# Patient Record
Sex: Female | Born: 1985 | Race: White | Hispanic: No | Marital: Married | Smoking: Former smoker
Health system: Southern US, Community
[De-identification: ages and names within clinical notes are randomized; demographics above are authoritative.]

## PROBLEM LIST (undated history)

## (undated) DIAGNOSIS — R87619 Unspecified abnormal cytological findings in specimens from cervix uteri: Secondary | ICD-10-CM

## (undated) DIAGNOSIS — N871 Moderate cervical dysplasia: Secondary | ICD-10-CM

## (undated) DIAGNOSIS — G43909 Migraine, unspecified, not intractable, without status migrainosus: Secondary | ICD-10-CM

## (undated) HISTORY — DX: Migraine, unspecified, not intractable, without status migrainosus: G43.909

## (undated) HISTORY — PX: WISDOM TOOTH EXTRACTION: SHX21

## (undated) HISTORY — PX: TONSILLECTOMY: SUR1361

## (undated) HISTORY — PX: LEEP: SHX91

## (undated) HISTORY — DX: Moderate cervical dysplasia: N87.1

## (undated) HISTORY — DX: Unspecified abnormal cytological findings in specimens from cervix uteri: R87.619

---
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
## 2006-04-20 DIAGNOSIS — R519 Headache, unspecified: Secondary | ICD-10-CM | POA: Insufficient documentation

## 2006-04-22 ENCOUNTER — Ambulatory Visit: Payer: Self-pay

## 2006-05-28 ENCOUNTER — Emergency Department: Payer: Self-pay | Admitting: Emergency Medicine

## 2007-11-22 ENCOUNTER — Emergency Department: Payer: Self-pay | Admitting: Emergency Medicine

## 2009-04-24 ENCOUNTER — Ambulatory Visit: Payer: Self-pay

## 2011-01-26 ENCOUNTER — Ambulatory Visit: Payer: Self-pay

## 2016-09-09 DIAGNOSIS — Z124 Encounter for screening for malignant neoplasm of cervix: Secondary | ICD-10-CM | POA: Diagnosis not present

## 2016-09-09 DIAGNOSIS — Z1389 Encounter for screening for other disorder: Secondary | ICD-10-CM | POA: Diagnosis not present

## 2016-09-09 DIAGNOSIS — Z3041 Encounter for surveillance of contraceptive pills: Secondary | ICD-10-CM | POA: Diagnosis not present

## 2016-09-09 DIAGNOSIS — Z01419 Encounter for gynecological examination (general) (routine) without abnormal findings: Secondary | ICD-10-CM | POA: Diagnosis not present

## 2016-09-09 LAB — HM PAP SMEAR

## 2016-11-25 ENCOUNTER — Encounter: Payer: Self-pay | Admitting: Family Medicine

## 2016-11-25 ENCOUNTER — Ambulatory Visit (INDEPENDENT_AMBULATORY_CARE_PROVIDER_SITE_OTHER): Payer: BLUE CROSS/BLUE SHIELD | Admitting: Family Medicine

## 2016-11-25 VITALS — BP 124/66 | HR 100 | Temp 98.4°F | Resp 17 | Wt 194.2 lb

## 2016-11-25 DIAGNOSIS — Z8742 Personal history of other diseases of the female genital tract: Secondary | ICD-10-CM | POA: Insufficient documentation

## 2016-11-25 DIAGNOSIS — Z85828 Personal history of other malignant neoplasm of skin: Secondary | ICD-10-CM | POA: Insufficient documentation

## 2016-11-25 DIAGNOSIS — J45909 Unspecified asthma, uncomplicated: Secondary | ICD-10-CM | POA: Insufficient documentation

## 2016-11-25 DIAGNOSIS — B349 Viral infection, unspecified: Secondary | ICD-10-CM

## 2016-11-25 DIAGNOSIS — J309 Allergic rhinitis, unspecified: Secondary | ICD-10-CM | POA: Insufficient documentation

## 2016-11-25 LAB — POC INFLUENZA A&B (BINAX/QUICKVUE)
Influenza A, POC: NEGATIVE
Influenza B, POC: NEGATIVE

## 2016-11-25 MED ORDER — HYDROCODONE-HOMATROPINE 5-1.5 MG/5ML PO SYRP: ORAL_SOLUTION | ORAL | 0 refills | Status: DC

## 2016-11-25 NOTE — Patient Instructions (Signed)
Discussed use of Mucinex D and two Aleve twice daily for aches along with cough syrup.

## 2016-11-25 NOTE — Progress Notes (Signed)
Subjective:     Patient ID: Desiree Rivera, female   DOB: [birth year], 31 y.o.   MRN: [id number]  HPI  Chief Complaint  Patient presents with  . Cough    Patient comes in offie today with cocnerns of cough and low grade fever since 11/23/16. Patient reports that fever has been ranging from 98-102, paitent reports the following symptoms; congestion, sinus pain and pressure, muscle ache and vomiting. Patient has been taking otc Dayquil and Mucinex.   Has not had the flu shot. 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   Review of Systems     Objective:   Physical Exam  Constitutional: She appears well-developed and well-nourished. She has a sickly appearance.  Ears: T.M's intact without inflammation Throat: tonsils absent; no posterior pharyngeal erythema Neck: no cervical adenopathy Lungs: clear     Assessment:    1. Viral syndrome: suspect flu - POC Influenza A&B(BINAX/QUICKVUE) - HYDROcodone-homatropine (HYCODAN) 5-1.5 MG/5ML syrup; 5 ml 4-6 hours as needed for cough  Dispense: 240 mL; Refill: 0    Plan:    Discussed use of mask, Mucinex D and nsaid's while ill.

## 2016-11-30 DIAGNOSIS — L814 Other melanin hyperpigmentation: Secondary | ICD-10-CM | POA: Diagnosis not present

## 2016-11-30 DIAGNOSIS — D229 Melanocytic nevi, unspecified: Secondary | ICD-10-CM | POA: Diagnosis not present

## 2016-11-30 DIAGNOSIS — Z1283 Encounter for screening for malignant neoplasm of skin: Secondary | ICD-10-CM | POA: Diagnosis not present

## 2016-11-30 DIAGNOSIS — L578 Other skin changes due to chronic exposure to nonionizing radiation: Secondary | ICD-10-CM | POA: Diagnosis not present

## 2017-07-29 ENCOUNTER — Encounter: Payer: Self-pay | Admitting: Family Medicine

## 2017-11-13 ENCOUNTER — Other Ambulatory Visit: Payer: Self-pay | Admitting: Obstetrics and Gynecology

## 2017-11-18 ENCOUNTER — Ambulatory Visit: Payer: BLUE CROSS/BLUE SHIELD | Admitting: Family Medicine

## 2017-11-18 VITALS — BP 130/80 | HR 50 | Temp 99.0°F | Resp 16 | Ht 65.0 in | Wt 185.0 lb

## 2017-11-18 DIAGNOSIS — M5441 Lumbago with sciatica, right side: Secondary | ICD-10-CM

## 2017-11-18 DIAGNOSIS — G8929 Other chronic pain: Secondary | ICD-10-CM

## 2017-11-18 DIAGNOSIS — M7061 Trochanteric bursitis, right hip: Secondary | ICD-10-CM

## 2017-11-18 MED ORDER — PREDNISONE 20 MG PO TABS: ORAL_TABLET | ORAL | 0 refills | Status: DC

## 2017-11-18 NOTE — Progress Notes (Signed)
Subjective:     Patient ID: Desiree Rivera, female   DOB: 1986/05/10, 31 y.o.   MRN: [id number] Chief Complaint  Patient presents with  . Back Pain    she reports that she has been having pain in her right hip buttock area. she reports that she was working out alot then stopped for about a year then she started having pain so she thought mayne it was becuase she stopped working out so she has went back to working out and lost 25 pounds ans it is not better. She has also been to the Chiropractor and he was able to relieve some of the pain. She reports that it hurts all the time. It hurts to life her leg and to walk to sit.   . Hip Pain   HPI States pain in her right sided back, right upper thigh, and right lateral hip have been occurring since March of this year. She continues to use the treadmill and weights. 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 She has had 6 massage sessions and one chiropractor visit a week ago with transient improvement. Will take ibuprofen 600 mg with Tylenol 1000 mg.at night when she can't sleep due to pain. She generally can not sleep on the right side.     Objective:   Physical Exam  Constitutional: She appears well-developed and well-nourished. No distress.  Musculoskeletal:  Muscle strength in lower extremities 5/5. SLR's to 90 degrees without radiation of back pain. Right hip IR/ER without discomfort. Tender in her R SI, trochanteric bursa, and ischial/sciatic area.       Assessment:    1. Trochanteric bursitis of right hip - predniSONE (DELTASONE) 20 MG tablet; Taper as follows: 3 pills for 4 days, two pills for 4 days, one pill for four days  Dispense: 24 tablet; Refill: 0  2. Chronic right-sided low back pain with right-sided sciatic - predniSONE (DELTASONE) 20 MG tablet; Taper as follows: 3 pills for 4 days, two pills for 4 days, one pill for four days  Dispense: 24 tablet; Refill: 0    Plan:    Discussed cross training  and rational use of Tylenol. Will call for orthopedic referral if not improving with prednisone.

## 2017-11-18 NOTE — Patient Instructions (Signed)
May continue to take tylenol up to 3000 mg/day. Call me if not improving for an orthopedic referral. Cross train and give your back a rest. Walking ok.

## 2017-12-22 ENCOUNTER — Ambulatory Visit: Payer: BLUE CROSS/BLUE SHIELD | Admitting: Family Medicine

## 2017-12-22 ENCOUNTER — Encounter: Payer: Self-pay | Admitting: Family Medicine

## 2017-12-22 VITALS — BP 118/78 | HR 84 | Temp 98.4°F | Wt 182.6 lb

## 2017-12-22 DIAGNOSIS — R509 Fever, unspecified: Secondary | ICD-10-CM

## 2017-12-22 DIAGNOSIS — B349 Viral infection, unspecified: Secondary | ICD-10-CM

## 2017-12-22 DIAGNOSIS — R52 Pain, unspecified: Secondary | ICD-10-CM

## 2017-12-22 LAB — POCT INFLUENZA A/B
INFLUENZA A, POC: NEGATIVE
Influenza B, POC: NEGATIVE

## 2017-12-22 NOTE — Progress Notes (Signed)
Subjective:     Patient ID: Desiree Rivera, female   DOB: 06-Oct-1986, 32 y.o.   MRN: [id number] Chief Complaint  Patient presents with  . URI    Patient complains of sinus pressure/pain,fever, cough, congestion,sore throat,sweats, and light sensitivity. Symptoms started on Monday and are gradually worsening. She reports taking Zicam. DyQuil, NyQuil and Sudafed for symptoms with no relief.    HPI Reports flu like sx onset over the last 3 days. No flu shot this season. Reports she has left over cough syrup at home.  Review of Systems     Objective:   Physical Exam  Constitutional: She appears well-developed and well-nourished. No distress.  Ears: T.M's intact without inflammation Throat: tonsils absent without posterior pharyngeal erythema Neck: mild right anterior cervical tendernes Lungs: clear     Assessment:    1. Fever, unspecified fever cause - POCT Influenza A/B  2. Generalized body aches - POCT Influenza A/B  3. Acute viral syndrome: possible flu    Plan:   Work excuse from 1/30-12/26/17. Discussed sx treatment. Masks provided.

## 2017-12-22 NOTE — Patient Instructions (Signed)
Discussed Mucinex D, prescription cough syrup or Delsym for cough

## 2018-01-10 ENCOUNTER — Telehealth: Payer: Self-pay

## 2018-01-10 MED ORDER — NORGESTIM-ETH ESTRAD TRIPHASIC 0.18/0.215/0.25 MG-35 MCG PO TABS: 1.0000 | ORAL_TABLET | Freq: Every day | ORAL | 0 refills | Status: DC

## 2018-01-10 NOTE — Telephone Encounter (Signed)
Pt sched annual for 3/4th and need refill of bc.  Pt aware refill eRx.

## 2018-01-24 ENCOUNTER — Encounter: Payer: Self-pay | Admitting: Obstetrics and Gynecology

## 2018-01-24 ENCOUNTER — Ambulatory Visit (INDEPENDENT_AMBULATORY_CARE_PROVIDER_SITE_OTHER): Payer: BLUE CROSS/BLUE SHIELD | Admitting: Obstetrics and Gynecology

## 2018-01-24 VITALS — BP 122/74 | Ht 65.0 in | Wt 180.0 lb

## 2018-01-24 DIAGNOSIS — Z1339 Encounter for screening examination for other mental health and behavioral disorders: Secondary | ICD-10-CM

## 2018-01-24 DIAGNOSIS — Z113 Encounter for screening for infections with a predominantly sexual mode of transmission: Secondary | ICD-10-CM

## 2018-01-24 DIAGNOSIS — Z01419 Encounter for gynecological examination (general) (routine) without abnormal findings: Secondary | ICD-10-CM | POA: Diagnosis not present

## 2018-01-24 DIAGNOSIS — Z1331 Encounter for screening for depression: Secondary | ICD-10-CM | POA: Diagnosis not present

## 2018-01-24 DIAGNOSIS — Z3041 Encounter for surveillance of contraceptive pills: Secondary | ICD-10-CM | POA: Diagnosis not present

## 2018-01-24 DIAGNOSIS — Z124 Encounter for screening for malignant neoplasm of cervix: Secondary | ICD-10-CM | POA: Diagnosis not present

## 2018-01-24 LAB — HM PAP SMEAR: HM Pap smear: NEGATIVE

## 2018-01-24 MED ORDER — NORGESTIM-ETH ESTRAD TRIPHASIC 0.18/0.215/0.25 MG-35 MCG PO TABS
1.0000 | ORAL_TABLET | Freq: Every day | ORAL | 4 refills | Status: DC
Start: 2018-01-24 — End: 2019-04-07

## 2018-01-24 NOTE — Progress Notes (Signed)
Gynecology Annual Exam   PCP: Malva LimesFisher, Donald E, MD  Chief Complaint  Patient presents with  . Annual Exam   History of Present Illness:  [name] is a 32 y.o. G3P0030 who LMP was Patient's last menstrual period was 01/10/2018., presents today for her annual examination.  Her menses are regular every 28-30 days, lasting 3-4 day(s).  Dysmenorrhea mild, occurring first 1-2 days of flow. She does not have intermenstrual bleeding.  She is single partner, contraception - OCP (estrogen/progesterone).  Last Pap: 08/2016  Results were: no abnormalities /neg HPV DNA negative (history of LEEP in 01/2015 with CIN II on pathology with negative margins) Hx of STDs: HPV  There is no FH of breast cancer. There is no FH of ovarian cancer.   Tobacco use: former. Alcohol use: social drinker Exercise: very active  The patient wears seatbelts: yes.   The patient reports that domestic violence in her life is absent.   Patient self-diagnosed with migraines. Treats with OTC ibuprofen/tylenol, denies aura.  Does not take Rx medication for migraines.    Past Medical History:  Diagnosis Date  . Abnormal Pap smear of cervix   . CIN II (cervical intraepithelial neoplasia II)   . Migraine     Past Surgical History:  Procedure Laterality Date  . LEEP    . TONSILLECTOMY    . WISDOM TOOTH EXTRACTION      Prior to Admission medications   Medication Sig Start Date End Date Taking? Authorizing Provider  MULTIPLE VITAMIN PO Take by mouth.   Yes [provider]  Norgestimate-Ethinyl Estradiol Triphasic (TRI-SPRINTEC) 0.18/0.215/0.25 MG-35 MCG tablet Take 1 tablet by mouth daily. 01/10/18  Yes Conard NovakJackson, Prerana Strayer D, MD  Pseudoephedrine-APAP-DM (DAYQUIL PO) Take by mouth.    [provider]    Allergies  Allergen Reactions  . Sulfa Antibiotics Rash   Obstetric History: G3P0030  Social History   Socioeconomic History  . Marital status: Married    Spouse name: Not on file   . Number of children: Not on file  . Years of education: Not on file  . Highest education level: Not on file  Social Needs  . Financial resource strain: Not on file  . Food insecurity - worry: Not on file  . Food insecurity - inability: Not on file  . Transportation needs - medical: Not on file  . Transportation needs - non-medical: Not on file  Occupational History  . Not on file  Tobacco Use  . Smoking status: Former Smoker    Last attempt to quit: 11/24/2007    Years since quitting: 10.1  . Smokeless tobacco: Never Used  Substance and Sexual Activity  . Alcohol use: Not on file  . Drug use: No  . Sexual activity: Yes    Partners: Male    Birth control/protection: Pill  Other Topics Concern  . Not on file  Social History Narrative  . Not on file    Family History  Problem Relation Age of Onset  . Diabetes Mellitus II Maternal Grandmother   . Thyroid cancer Maternal Aunt   . Lung cancer Maternal Grandfather     Review of Systems  Constitutional: Negative.   HENT: Negative.   Eyes: Negative.   Respiratory: Negative.   Cardiovascular: Negative.   Gastrointestinal: Negative.   Genitourinary: Negative.   Musculoskeletal: Negative.   Skin: Negative.   Neurological: Negative.   Psychiatric/Behavioral: Negative.      Physical Exam BP 122/74   Ht  5\' 5"  (1.651 m)   Wt 180 lb (81.6 kg)   LMP 01/10/2018   BMI 29.95 kg/m    Physical Exam  Constitutional: She is oriented to person, place, and time. She appears well-developed and well-nourished. No distress.  Genitourinary: Uterus normal. Pelvic exam was performed with patient supine. There is no rash, tenderness, lesion or injury on the right labia. There is no rash, tenderness, lesion or injury on the left labia. No erythema, tenderness or bleeding in the vagina. No signs of injury around the vagina. No vaginal discharge found. Right adnexum does not display mass, does not display tenderness and does not display  fullness. Left adnexum does not display mass, does not display tenderness and does not display fullness. Cervix does not exhibit motion tenderness, lesion, discharge or polyp.   Uterus is mobile and anteverted. Uterus is not enlarged, tender or exhibiting a mass.  HENT:  Head: Normocephalic and atraumatic.  Eyes: EOM are normal. No scleral icterus.  Neck: Normal range of motion. Neck supple. No thyromegaly present.  Cardiovascular: Normal rate and regular rhythm. Exam reveals no gallop and no friction rub.  No murmur heard. Pulmonary/Chest: Effort normal and breath sounds normal. No respiratory distress. She has no wheezes. She has no rales. Right breast exhibits no inverted nipple, no mass, no nipple discharge, no skin change and no tenderness. Left breast exhibits no inverted nipple, no mass, no nipple discharge, no skin change and no tenderness.  Abdominal: Soft. Bowel sounds are normal. She exhibits no distension and no mass. There is no tenderness. There is no rebound and no guarding.  Musculoskeletal: Normal range of motion. She exhibits no edema or tenderness.  Lymphadenopathy:    She has no cervical adenopathy.       Right: No inguinal adenopathy present.       Left: No inguinal adenopathy present.  Neurological: She is alert and oriented to person, place, and time. No cranial nerve deficit.  Skin: Skin is warm and dry. No rash noted. No erythema.  Psychiatric: She has a normal mood and affect. Her behavior is normal. Judgment normal.    Female chaperone present for pelvic and breast  portions of the physical exam  Results: AUDIT Questionnaire (screen for alcoholism): 5 PHQ-9: 1  Assessment: 32 y.o. G42P0030 female here for routine annual gynecologic examination  Plan: Problem List Items Addressed This Visit    None    Visit Diagnoses    Women's annual routine gynecological examination    -  Primary   Relevant Medications   Norgestimate-Ethinyl Estradiol Triphasic  (TRI-SPRINTEC) 0.18/0.215/0.25 MG-35 MCG tablet   Other Relevant Orders   IGP,CtNg,AptimaHPV,rfx16/18,45   Screening for depression       Screening for alcoholism       Pap smear for cervical cancer screening       Relevant Orders   IGP,CtNg,AptimaHPV,rfx16/18,45   Screen for STD (sexually transmitted disease)       Relevant Orders   IGP,CtNg,AptimaHPV,rfx16/18,45   Encounter for surveillance of contraceptive pills       Relevant Medications   Norgestimate-Ethinyl Estradiol Triphasic (TRI-SPRINTEC) 0.18/0.215/0.25 MG-35 MCG tablet     Screening: -- Blood pressure screen normal -- Weight screening: overweight: continue to monitor -- Depression screening negative (PHQ-9) -- Nutrition: normal -- cholesterol screening: not due for screening -- osteoporosis screening: not due -- tobacco screening: not using -- alcohol screening: AUDIT questionnaire indicates low-risk usage. -- family history of breast cancer screening: done. not at high risk. -- no  evidence of domestic violence or intimate partner violence. -- STD screening: gonorrhea/chlamydia NAAT collected -- pap smear collected per ASCCP guidelines -- flu vaccine declined -- HPV vaccination series: not eligilbe  Thomasene Mohair, MD 01/24/2018 4:19 PM

## 2018-01-28 LAB — IGP,CTNG,APTIMAHPV,RFX16/18,45
Chlamydia, Nuc. Acid Amp: NEGATIVE
GONOCOCCUS BY NUCLEIC ACID AMP: NEGATIVE
HPV Aptima: NEGATIVE
PAP SMEAR COMMENT: 0

## 2019-03-06 ENCOUNTER — Encounter: Payer: Self-pay | Admitting: Family Medicine

## 2019-03-06 DIAGNOSIS — L237 Allergic contact dermatitis due to plants, except food: Secondary | ICD-10-CM

## 2019-03-07 MED ORDER — PREDNISONE 20 MG PO TABS: ORAL_TABLET | ORAL | 0 refills | Status: AC

## 2019-03-07 MED ORDER — CEPHALEXIN 500 MG PO CAPS: 500.0000 mg | ORAL_CAPSULE | Freq: Four times a day (QID) | ORAL | 0 refills | Status: AC

## 2019-04-05 ENCOUNTER — Other Ambulatory Visit: Payer: Self-pay | Admitting: Obstetrics and Gynecology

## 2019-04-05 DIAGNOSIS — Z3041 Encounter for surveillance of contraceptive pills: Secondary | ICD-10-CM

## 2019-04-05 DIAGNOSIS — Z01419 Encounter for gynecological examination (general) (routine) without abnormal findings: Secondary | ICD-10-CM

## 2019-04-05 NOTE — Telephone Encounter (Signed)
Advise

## 2019-04-07 NOTE — Telephone Encounter (Signed)
Can someone please let patients know, who are calling multiple times to check on the status of a prescription or question, that the provider is out of the office?? This would help so much with patient satisfaction.

## 2019-04-07 NOTE — Telephone Encounter (Signed)
Pt calling to ck the status of refill she requested on Wed.  Pharm hasn't heard back.  [phone]

## 2019-06-01 ENCOUNTER — Encounter: Payer: Self-pay | Admitting: Family Medicine

## 2019-06-02 MED ORDER — KETOCONAZOLE 2 % EX CREA: 1.0000 "application " | TOPICAL_CREAM | Freq: Two times a day (BID) | CUTANEOUS | 0 refills | Status: DC

## 2019-06-23 ENCOUNTER — Other Ambulatory Visit: Payer: Self-pay

## 2019-06-23 DIAGNOSIS — Z01419 Encounter for gynecological examination (general) (routine) without abnormal findings: Secondary | ICD-10-CM

## 2019-06-23 DIAGNOSIS — Z3041 Encounter for surveillance of contraceptive pills: Secondary | ICD-10-CM

## 2019-06-23 MED ORDER — NORGESTIM-ETH ESTRAD TRIPHASIC 0.18/0.215/0.25 MG-35 MCG PO TABS: 1.0000 | ORAL_TABLET | Freq: Every day | ORAL | 0 refills | Status: DC

## 2019-06-23 NOTE — Telephone Encounter (Signed)
Spoke w/pt. Notified 1 mo refill sent to requested pharmacy.

## 2019-06-23 NOTE — Telephone Encounter (Signed)
Pt has scheduled AE for 07/07/2019 w/SDJ. She only has 1 wk of pills left. Please sent refill to [facility], [place]. AY#[phone]

## 2019-07-07 ENCOUNTER — Other Ambulatory Visit (HOSPITAL_COMMUNITY)
Admission: RE | Admit: 2019-07-07 | Discharge: 2019-07-07 | Disposition: A | Discharge status: Home / Self Care | Payer: BC Managed Care – PPO | Source: Ambulatory Visit | Attending: Obstetrics and Gynecology | Admitting: Obstetrics and Gynecology

## 2019-07-07 ENCOUNTER — Other Ambulatory Visit: Payer: Self-pay

## 2019-07-07 ENCOUNTER — Ambulatory Visit (INDEPENDENT_AMBULATORY_CARE_PROVIDER_SITE_OTHER): Payer: BC Managed Care – PPO | Admitting: Obstetrics and Gynecology

## 2019-07-07 ENCOUNTER — Encounter: Payer: Self-pay | Admitting: Obstetrics and Gynecology

## 2019-07-07 VITALS — BP 124/74 | Ht 65.0 in | Wt 182.0 lb

## 2019-07-07 DIAGNOSIS — Z01419 Encounter for gynecological examination (general) (routine) without abnormal findings: Secondary | ICD-10-CM | POA: Insufficient documentation

## 2019-07-07 DIAGNOSIS — Z113 Encounter for screening for infections with a predominantly sexual mode of transmission: Secondary | ICD-10-CM | POA: Insufficient documentation

## 2019-07-07 DIAGNOSIS — Z124 Encounter for screening for malignant neoplasm of cervix: Secondary | ICD-10-CM | POA: Diagnosis not present

## 2019-07-07 DIAGNOSIS — Z1339 Encounter for screening examination for other mental health and behavioral disorders: Secondary | ICD-10-CM

## 2019-07-07 DIAGNOSIS — Z3041 Encounter for surveillance of contraceptive pills: Secondary | ICD-10-CM

## 2019-07-07 DIAGNOSIS — Z1331 Encounter for screening for depression: Secondary | ICD-10-CM

## 2019-07-07 MED ORDER — NORGESTIM-ETH ESTRAD TRIPHASIC 0.18/0.215/0.25 MG-35 MCG PO TABS: 1.0000 | ORAL_TABLET | Freq: Every day | ORAL | 4 refills | Status: DC

## 2019-07-07 NOTE — Progress Notes (Signed)
Gynecology Annual Exam  PCP: Malva LimesFisher, Donald E, MD  Chief Complaint  Patient presents with  . Annual Exam   History of Present Illness:  Ms. Desiree Rivera is a 33 y.o. G3P0030 who LMP was Patient's last menstrual period was 06/18/2019., presents today for her annual examination.  Her menses are regular every 28-30 days, lasting 5 day(s).  Dysmenorrhea mild, occurring first 1-2 days of flow. She does not have intermenstrual bleeding. She states that she gets a migraine headache only very occasionally. She gets other types of headaches more frequently.  She has issues with clenching her teeth, sinus issues.  If she ever gets a migraine, she covers her face up and gets in a quiet place.    She is sexually active with a single partner.  Contraception - OCP (estrogen/progesterone).  Last Pap: 1 year  Results were: no abnormalities /neg HPV DNA negative (history of LEEP in 2016 for CIN II with negative margins) Hx of STDs: HPV  There is no FH of breast cancer. There is no FH of ovarian cancer.   Tobacco use: former. Alcohol use: social drinker Exercise: very active  The patient wears seatbelts: yes.   The patient reports that domestic violence in her life is absent.   Past Medical History:  Diagnosis Date  . Abnormal Pap smear of cervix   . CIN II (cervical intraepithelial neoplasia II)   . Migraine     Past Surgical History:  Procedure Laterality Date  . LEEP    . TONSILLECTOMY    . WISDOM TOOTH EXTRACTION      Prior to Admission medications   Medication Sig Start Date End Date Taking? Authorizing Provider  MULTIPLE VITAMIN PO Take by mouth.   Yes [provider]  Norgestimate-Ethinyl Estradiol Triphasic (TRI-SPRINTEC) 0.18/0.215/0.25 MG-35 MCG tablet Take 1 tablet by mouth daily for 28 days. 06/23/19 07/21/19 Yes Tresea MallGledhill, Jane, CNM  ketoconazole (NIZORAL) 2 % cream Apply 1 application topically 2 (two) times daily. Patient not taking: Reported on 07/07/2019 06/02/19    Malva LimesFisher, Donald E, MD  Pseudoephedrine-APAP-DM (DAYQUIL PO) Take by mouth.    [provider]    Allergies  Allergen Reactions  . Sulfa Antibiotics Rash   Obstetric History: G3P0030  Social History   Socioeconomic History  . Marital status: Married    Spouse name: Not on file  . Number of children: Not on file  . Years of education: Not on file  . Highest education level: Not on file  Occupational History  . Not on file  Social Needs  . Financial resource strain: Not on file  . Food insecurity    Worry: Not on file    Inability: Not on file  . Transportation needs    Medical: Not on file    Non-medical: Not on file  Tobacco Use  . Smoking status: Former Smoker    Quit date: 11/24/2007    Years since quitting: 11.6  . Smokeless tobacco: Never Used  Substance and Sexual Activity  . Alcohol use: Not on file  . Drug use: No  . Sexual activity: Yes    Partners: Male    Birth control/protection: Pill  Lifestyle  . Physical activity    Days per week: Not on file    Minutes per session: Not on file  . Stress: Not on file  Relationships  . Social Musicianconnections    Talks on phone: Not on file    Gets together: Not on file  Attends religious service: Not on file    Active member of club or organization: Not on file    Attends meetings of clubs or organizations: Not on file    Relationship status: Not on file  . Intimate partner violence    Fear of current or ex partner: Not on file    Emotionally abused: Not on file    Physically abused: Not on file    Forced sexual activity: Not on file  Other Topics Concern  . Not on file  Social History Narrative  . Not on file    Family History  Problem Relation Age of Onset  . Diabetes Mellitus II Maternal Grandmother   . Thyroid cancer Maternal Aunt   . Lung cancer Maternal Grandfather     Review of Systems  Constitutional: Negative.   HENT: Negative.   Eyes: Negative.   Respiratory: Negative.    Cardiovascular: Negative.   Gastrointestinal: Negative.   Genitourinary: Negative.   Musculoskeletal: Negative.   Skin: Negative.   Neurological: Negative.   Psychiatric/Behavioral: Negative.      Physical Exam BP 124/74   Ht 5\' 5"  (1.651 m)   Wt 182 lb (82.6 kg)   LMP 06/18/2019   BMI 30.29 kg/m   Physical Exam Constitutional:      General: She is not in acute distress.    Appearance: Normal appearance. She is well-developed.  Genitourinary:     Pelvic exam was performed with patient supine.     Vulva, urethra, bladder and uterus normal.     No inguinal adenopathy present in the right or left side.    No signs of injury in the vagina.     No vaginal discharge, erythema, tenderness or bleeding.     No cervical motion tenderness, discharge, lesion or polyp.     Uterus is mobile.     Uterus is not enlarged or tender.     No uterine mass detected.    Uterus is anteverted.     No right or left adnexal mass present.     Right adnexa not tender or full.     Left adnexa not tender or full.  HENT:     Head: Normocephalic and atraumatic.  Eyes:     General: No scleral icterus.    Conjunctiva/sclera: Conjunctivae normal.  Neck:     Musculoskeletal: Normal range of motion and neck supple.     Thyroid: No thyromegaly.  Cardiovascular:     Rate and Rhythm: Normal rate and regular rhythm.     Heart sounds: No murmur. No friction rub. No gallop.   Pulmonary:     Effort: Pulmonary effort is normal. No respiratory distress.     Breath sounds: Normal breath sounds. No wheezing or rales.  Chest:     Breasts:        Right: No inverted nipple, mass, nipple discharge, skin change or tenderness.        Left: No inverted nipple, mass, nipple discharge, skin change or tenderness.  Abdominal:     General: Bowel sounds are normal. There is no distension.     Palpations: Abdomen is soft. There is no mass.     Tenderness: There is no abdominal tenderness. There is no guarding or rebound.   Musculoskeletal: Normal range of motion.        General: No swelling or tenderness.  Lymphadenopathy:     Cervical: No cervical adenopathy.     Lower Body: No right inguinal adenopathy. No left  inguinal adenopathy.  Neurological:     General: No focal deficit present.     Mental Status: She is alert and oriented to person, place, and time.     Cranial Nerves: No cranial nerve deficit.  Skin:    General: Skin is warm and dry.     Findings: No erythema or rash.  Psychiatric:        Mood and Affect: Mood normal.        Behavior: Behavior normal.        Judgment: Judgment normal.     Female chaperone present for pelvic and breast  portions of the physical exam  Results: AUDIT Questionnaire (screen for alcoholism): 7 PHQ-9: 3   Assessment: 33 y.o. G59P0030 female here for routine annual gynecologic examination  Plan: Problem List Items Addressed This Visit    None    Visit Diagnoses    Women's annual routine gynecological examination    -  Primary   Relevant Medications   Norgestimate-Ethinyl Estradiol Triphasic (TRI-SPRINTEC) 0.18/0.215/0.25 MG-35 MCG tablet   Other Relevant Orders   Cytology - PAP   Screening for depression       Screening for alcoholism       Pap smear for cervical cancer screening       Relevant Orders   Cytology - PAP   Encounter for surveillance of contraceptive pills       Relevant Medications   Norgestimate-Ethinyl Estradiol Triphasic (TRI-SPRINTEC) 0.18/0.215/0.25 MG-35 MCG tablet   Screen for STD (sexually transmitted disease)       Relevant Orders   Cytology - PAP      Screening: -- Blood pressure screen normal -- Weight screening: overweight: continue to monitor -- Depression screening negative (PHQ-9) -- Nutrition: normal -- cholesterol screening: not due for screening -- osteoporosis screening: not due -- tobacco screening: not using -- alcohol screening: AUDIT questionnaire indicates low-risk usage. -- family history of breast  cancer screening: done. not at high risk. -- no evidence of domestic violence or intimate partner violence. -- STD screening: gonorrhea/chlamydia NAAT collected -- pap smear collected per ASCCP guidelines  Contraceptive management: renewed contraception. Discussed migraines as a contraindication to use of estrogen-containing contraception. She states that she has headaches, but has never had to take anything stronger than Tylenol or ibuprofen. They don't last long. She has no aura.  She has never been formally diagnosed with a migraine disorder.  She has been taking this form of contraception since age 7.  Precautions given and risks. She would like to continue. For now, I think this is reasonable.  However, if her headaches become worse or more frequent, we discussed having to change types of contraception.   Prentice Docker, MD 07/07/2019 9:37 AM

## 2019-07-11 LAB — CYTOLOGY - PAP
Chlamydia: NEGATIVE
Diagnosis: NEGATIVE
HPV: NOT DETECTED
Neisseria Gonorrhea: NEGATIVE

## 2019-07-18 DIAGNOSIS — M530 Cervicocranial syndrome: Secondary | ICD-10-CM | POA: Diagnosis not present

## 2019-07-18 DIAGNOSIS — M9902 Segmental and somatic dysfunction of thoracic region: Secondary | ICD-10-CM | POA: Diagnosis not present

## 2019-07-18 DIAGNOSIS — M531 Cervicobrachial syndrome: Secondary | ICD-10-CM | POA: Diagnosis not present

## 2019-07-18 DIAGNOSIS — M5386 Other specified dorsopathies, lumbar region: Secondary | ICD-10-CM | POA: Diagnosis not present

## 2019-08-02 DIAGNOSIS — M530 Cervicocranial syndrome: Secondary | ICD-10-CM | POA: Diagnosis not present

## 2019-08-02 DIAGNOSIS — M531 Cervicobrachial syndrome: Secondary | ICD-10-CM | POA: Diagnosis not present

## 2019-08-02 DIAGNOSIS — M9901 Segmental and somatic dysfunction of cervical region: Secondary | ICD-10-CM | POA: Diagnosis not present

## 2019-08-02 DIAGNOSIS — M9902 Segmental and somatic dysfunction of thoracic region: Secondary | ICD-10-CM | POA: Diagnosis not present

## 2019-08-24 ENCOUNTER — Encounter: Payer: Self-pay | Admitting: Family Medicine

## 2019-08-24 MED ORDER — KETOCONAZOLE 2 % EX CREA: 1.0000 "application " | TOPICAL_CREAM | Freq: Two times a day (BID) | CUTANEOUS | 0 refills | Status: DC

## 2020-09-17 ENCOUNTER — Other Ambulatory Visit: Payer: Self-pay | Admitting: Obstetrics and Gynecology

## 2020-09-17 DIAGNOSIS — Z3041 Encounter for surveillance of contraceptive pills: Secondary | ICD-10-CM

## 2020-09-17 DIAGNOSIS — Z01419 Encounter for gynecological examination (general) (routine) without abnormal findings: Secondary | ICD-10-CM

## 2020-10-07 ENCOUNTER — Other Ambulatory Visit: Payer: Self-pay

## 2020-10-07 ENCOUNTER — Ambulatory Visit (INDEPENDENT_AMBULATORY_CARE_PROVIDER_SITE_OTHER): Payer: BC Managed Care – PPO | Admitting: Family Medicine

## 2020-10-07 ENCOUNTER — Encounter: Payer: Self-pay | Admitting: Family Medicine

## 2020-10-07 VITALS — BP 133/82 | HR 67 | Temp 98.7°F | Resp 16 | Wt 174.0 lb

## 2020-10-07 DIAGNOSIS — M25559 Pain in unspecified hip: Secondary | ICD-10-CM | POA: Diagnosis not present

## 2020-10-07 DIAGNOSIS — M545 Low back pain, unspecified: Secondary | ICD-10-CM | POA: Diagnosis not present

## 2020-10-07 DIAGNOSIS — M546 Pain in thoracic spine: Secondary | ICD-10-CM | POA: Diagnosis not present

## 2020-10-07 LAB — POCT URINALYSIS DIPSTICK
Bilirubin, UA: NEGATIVE
Blood, UA: NEGATIVE
Glucose, UA: NEGATIVE
Ketones, UA: NEGATIVE
Leukocytes, UA: NEGATIVE
Nitrite, UA: NEGATIVE
Protein, UA: NEGATIVE
Spec Grav, UA: 1.02 (ref 1.010–1.025)
Urobilinogen, UA: 0.2 E.U./dL
pH, UA: 6 (ref 5.0–8.0)

## 2020-10-07 LAB — POCT URINE PREGNANCY: Preg Test, Ur: NEGATIVE

## 2020-10-07 MED ORDER — PREDNISONE 10 MG PO TABS: ORAL_TABLET | ORAL | 0 refills | Status: AC

## 2020-10-07 NOTE — Progress Notes (Signed)
Established patient visit   Patient: Desiree Rivera   DOB: 03/23/1986   34 y.o. Female  MRN: [id number] Visit Date: 10/07/2020  Today's healthcare provider: Mila Merry, MD   Chief Complaint  Patient presents with  . Back Pain   Subjective    Back Pain This is a recurrent problem. Episode onset: 6 months ago. The problem has been waxing and waning since onset. Pain location: mid back right side. The quality of the pain is described as aching. The pain is at a severity of 4/10. The symptoms are aggravated by sitting and lying down. She has tried home exercises and heat (standing) for the symptoms. The treatment provided mild relief.   Patient reports having problems with sciatica several years ago which responded very well to prednisone. However current sx seem to radiate much more extensively than prior episodes, wrapping around right flank and extending into right posterior, right internal hip, and right lateral thigh. Pain is severe when she lies down at night, and kept her up all night last night. Has tried several OTC medications including NSAIDs and tylenol with no relief.       Medications: Outpatient Medications Prior to Visit  Medication Sig  . MULTIPLE VITAMIN PO Take by mouth.  . TRI-SPRINTEC 0.18/0.215/0.25 MG-35 MCG tablet TAKE ONE TABLET BY MOUTH DAILY  . [DISCONTINUED] ketoconazole (NIZORAL) 2 % cream Apply 1 application topically 2 (two) times daily. (Patient not taking: Reported on 10/07/2020)  . [DISCONTINUED] Pseudoephedrine-APAP-DM (DAYQUIL PO) Take by mouth. (Patient not taking: Reported on 10/07/2020)   No facility-administered medications prior to visit.    Review of Systems  Musculoskeletal: Positive for back pain (mid back; right side radiates down into right side).      Objective    BP 133/82 (BP Location: Left Arm, Patient Position: Sitting, Cuff Size: Large)   Pulse 67   Temp 98.7 F (37.1 C) (Oral)   Resp 16   Wt 174 lb (78.9 kg)    BMI 28.96 kg/m    Physical Exam   General appearance:  Overweight female, cooperative and in no acute distress Head: Normocephalic, without obvious abnormality, atraumatic Respiratory: Respirations even and unlabored, normal respiratory rate MS: Tender over T-Spine and L-Spine, tender paraspinous muscles right of midline.  Skin: Skin color, texture, turgor normal. No rashes seen  Psych: Appropriate mood and affect. Neurologic: Mental status: Alert, oriented to person, place, and time, thought content appropriate.   Results for orders placed or performed in visit on 10/07/20  POCT urinalysis dipstick  Result Value Ref Range   Color, UA yellow    Clarity, UA clear    Glucose, UA Negative Negative   Bilirubin, UA negative    Ketones, UA negative    Spec Grav, UA 1.020 1.010 - 1.025   Blood, UA negative    pH, UA 6.0 5.0 - 8.0   Protein, UA Negative Negative   Urobilinogen, UA 0.2 0.2 or 1.0 E.U./dL   Nitrite, UA negative    Leukocytes, UA Negative Negative   Appearance     Odor    POCT urine pregnancy  Result Value Ref Range   Preg Test, Ur Negative Negative    Assessment & Plan     1. Thoracic spine pain Associated with extensive, but unusual pattern of radiation throughout right trunk, hip joint and leg.  - predniSONE (DELTASONE) 10 MG tablet; 6 tablets for 2 days, then 5 for 2 days, then 4 for 2 days, then 3  for 2 days, then 2 for 2 days, then 1 for 2 days.  Dispense: 42 tablet; Refill: 0 - DG Thoracic Spine W/Swimmers; Future  2. Lumbar back pain  - predniSONE (DELTASONE) 10 MG tablet; 6 tablets for 2 days, then 5 for 2 days, then 4 for 2 days, then 3 for 2 days, then 2 for 2 days, then 1 for 2 days.  Dispense: 42 tablet; Refill: 0 - DG Lumbar Spine Complete; Future  3. Hip pain  - predniSONE (DELTASONE) 10 MG tablet; 6 tablets for 2 days, then 5 for 2 days, then 4 for 2 days, then 3 for 2 days, then 2 for 2 days, then 1 for 2 days.  Dispense: 42 tablet; Refill:  0 - DG Hip Unilat W OR W/O Pelvis 2-3 Views Right; Future       The entirety of the information documented in the History of Present Illness, Review of Systems and Physical Exam were personally obtained by me. Portions of this information were initially documented by the CMA and reviewed by me for thoroughness and accuracy.      Mila Merry, MD  [facility] [phone] (phone) [phone] (fax)  [facility]

## 2020-10-15 ENCOUNTER — Ambulatory Visit
Admission: RE | Admit: 2020-10-15 | Discharge: 2020-10-15 | Disposition: A | Discharge status: Home / Self Care | Payer: BC Managed Care – PPO | Source: Ambulatory Visit | Attending: Family Medicine | Admitting: Family Medicine

## 2020-10-15 ENCOUNTER — Ambulatory Visit
Admission: RE | Admit: 2020-10-15 | Discharge: 2020-10-15 | Disposition: A | Discharge status: Home / Self Care | Payer: BC Managed Care – PPO | Attending: Family Medicine | Admitting: Family Medicine

## 2020-10-15 ENCOUNTER — Other Ambulatory Visit: Payer: Self-pay

## 2020-10-15 DIAGNOSIS — M25559 Pain in unspecified hip: Secondary | ICD-10-CM

## 2020-10-15 DIAGNOSIS — M545 Low back pain, unspecified: Secondary | ICD-10-CM | POA: Diagnosis not present

## 2020-10-15 DIAGNOSIS — M4184 Other forms of scoliosis, thoracic region: Secondary | ICD-10-CM | POA: Diagnosis not present

## 2020-10-15 DIAGNOSIS — M546 Pain in thoracic spine: Secondary | ICD-10-CM

## 2020-10-15 DIAGNOSIS — M25551 Pain in right hip: Secondary | ICD-10-CM | POA: Diagnosis not present

## 2020-12-02 ENCOUNTER — Other Ambulatory Visit: Payer: Self-pay | Admitting: Obstetrics and Gynecology

## 2020-12-02 DIAGNOSIS — Z01419 Encounter for gynecological examination (general) (routine) without abnormal findings: Secondary | ICD-10-CM

## 2020-12-02 DIAGNOSIS — Z3041 Encounter for surveillance of contraceptive pills: Secondary | ICD-10-CM

## 2020-12-11 ENCOUNTER — Other Ambulatory Visit: Payer: Self-pay | Admitting: Obstetrics and Gynecology

## 2020-12-11 DIAGNOSIS — Z01419 Encounter for gynecological examination (general) (routine) without abnormal findings: Secondary | ICD-10-CM

## 2020-12-11 DIAGNOSIS — Z3041 Encounter for surveillance of contraceptive pills: Secondary | ICD-10-CM

## 2020-12-11 NOTE — Telephone Encounter (Signed)
Patient has apt 12/17/20. 1 rf sent. Patient aware.

## 2020-12-11 NOTE — Telephone Encounter (Signed)
Patient requested refill last week hasn't been authorized yet. Patient has 3 days left of meds. OI#[phone]

## 2020-12-17 ENCOUNTER — Ambulatory Visit (INDEPENDENT_AMBULATORY_CARE_PROVIDER_SITE_OTHER): Payer: BC Managed Care – PPO | Admitting: Obstetrics and Gynecology

## 2020-12-17 ENCOUNTER — Encounter: Payer: Self-pay | Admitting: Obstetrics and Gynecology

## 2020-12-17 ENCOUNTER — Other Ambulatory Visit: Payer: Self-pay

## 2020-12-17 VITALS — BP 128/88 | Ht 65.0 in | Wt 179.0 lb

## 2020-12-17 DIAGNOSIS — Z3041 Encounter for surveillance of contraceptive pills: Secondary | ICD-10-CM | POA: Diagnosis not present

## 2020-12-17 DIAGNOSIS — Z1331 Encounter for screening for depression: Secondary | ICD-10-CM

## 2020-12-17 DIAGNOSIS — Z1339 Encounter for screening examination for other mental health and behavioral disorders: Secondary | ICD-10-CM | POA: Diagnosis not present

## 2020-12-17 DIAGNOSIS — M5441 Lumbago with sciatica, right side: Secondary | ICD-10-CM

## 2020-12-17 DIAGNOSIS — Z01419 Encounter for gynecological examination (general) (routine) without abnormal findings: Secondary | ICD-10-CM

## 2020-12-17 DIAGNOSIS — G8929 Other chronic pain: Secondary | ICD-10-CM

## 2020-12-17 MED ORDER — NORGESTIM-ETH ESTRAD TRIPHASIC 0.18/0.215/0.25 MG-35 MCG PO TABS: 1.0000 | ORAL_TABLET | Freq: Every day | ORAL | 4 refills | Status: DC

## 2020-12-17 NOTE — Progress Notes (Signed)
Gynecology Annual Exam  PCP: Malva Limes, MD  Chief Complaint  Patient presents with  . Annual Exam    History of Present Illness:  Ms. Desiree Rivera is a 35 y.o. G3P0030 who LMP was Patient's last menstrual period was 12/10/2020., presents today for her annual examination.  Her menses are regular every 28-30 days, lasting 5 day(s).  Dysmenorrhea "mild-to-semi-moderate" for first day or so. She does not have intermenstrual bleeding.  She is sexually active. She uses combined OCPs.  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 There is no FH of ovarian cancer. The patient does not do self-breast exams.  Tobacco use: former Alcohol use: glass of wine with dinner Exercise: up until her back started hurting.  She is having right back pain that radiates to her right buttock.  She has tried prednisone. This did not help.     The patient wears seatbelts: yes.   The patient reports that domestic violence in her life is absent.   Past Medical History:  Diagnosis Date  . Abnormal Pap smear of cervix   . CIN II (cervical intraepithelial neoplasia II)   . Migraine     Past Surgical History:  Procedure Laterality Date  . LEEP    . TONSILLECTOMY    . WISDOM TOOTH EXTRACTION      Prior to Admission medications   Medication Sig Start Date End Date Taking? Authorizing Provider  MULTIPLE VITAMIN PO Take by mouth.    [provider]  TRI-SPRINTEC 0.18/0.215/0.25 MG-35 MCG tablet TAKE ONE TABLET BY MOUTH DAILY 12/11/20   Conard Novak, MD    Allergies  Allergen Reactions  . Sulfa Antibiotics Rash    Obstetric History: G3P0030  Social History   Socioeconomic History  . Marital status: Married    Spouse name: Not on file  . Number of children: Not on file  . Years of education: Not on file  .  Highest education level: Not on file  Occupational History  . Not on file  Tobacco Use  . Smoking status: Former Smoker    Quit date: 11/24/2007    Years since quitting: 13.0  . Smokeless tobacco: Never Used  Vaping Use  . Vaping Use: Never used  Substance and Sexual Activity  . Alcohol use: Not on file  . Drug use: No  . Sexual activity: Yes    Partners: Male    Birth control/protection: Pill  Other Topics Concern  . Not on file  Social History Narrative  . Not on file   Social Determinants of Health   Financial Resource Strain: Not on file  Food Insecurity: Not on file  Transportation Needs: Not on file  Physical Activity: Not on file  Stress: Not on file  Social Connections: Not on file  Intimate Partner Violence: Not on file    Family History  Problem Relation Age of Onset  . Diabetes Mellitus II Maternal Grandmother   . Thyroid cancer Maternal Aunt   . Lung cancer Maternal Grandfather     Review of Systems  Constitutional: Negative.   HENT: Negative.   Eyes: Negative.   Respiratory: Negative.   Cardiovascular: Negative.   Gastrointestinal: Negative.   Genitourinary: Negative.   Musculoskeletal: Positive for back pain, joint pain, myalgias and neck pain. Negative for falls.  Skin: Negative.   Neurological: Negative.   Psychiatric/Behavioral: Negative.         Physical Exam BP 128/88   Ht 5\' 5"  (1.651 m)   Wt 179 lb (81.2 kg)   LMP 12/10/2020   BMI 29.79 kg/m    Physical Exam Constitutional:      General: She is not in acute distress.    Appearance: Normal appearance. She is well-developed.  Genitourinary:     Vulva and bladder normal.     Right Labia: No rash, tenderness, lesions, skin changes or Bartholin's cyst.    Left Labia: No tenderness, lesions, skin changes, Bartholin's cyst or rash.    No inguinal adenopathy present in the right or left side.    Pelvic Tanner Score: 5/5.    No vaginal discharge, erythema, tenderness or bleeding.       Right Adnexa: not tender, not full and no mass present.    Left Adnexa: not tender, not full and no mass present.    No cervical motion tenderness, discharge, lesion or polyp.     Uterus is not enlarged or tender.     No uterine mass detected.    Pelvic exam was performed with patient in the lithotomy position.  Breasts:     Right: No inverted nipple, mass, nipple discharge, skin change or tenderness.     Left: No inverted nipple, mass, nipple discharge, skin change or tenderness.    HENT:     Head: Normocephalic and atraumatic.  Eyes:     General: No scleral icterus.    Conjunctiva/sclera: Conjunctivae normal.  Neck:     Thyroid: No thyromegaly.  Cardiovascular:     Rate and Rhythm: Normal rate and regular rhythm.     Heart sounds: No murmur heard. No friction rub. No gallop.   Pulmonary:     Effort: Pulmonary effort is normal. No respiratory distress.     Breath sounds: Normal breath sounds. No wheezing or rales.  Abdominal:     General: Bowel sounds are normal. There is no distension.     Palpations: Abdomen is soft. There is no mass.     Tenderness: There is no abdominal tenderness. There is no right CVA tenderness, left CVA tenderness, guarding or rebound.     Hernia: There is no hernia in the left inguinal area or right inguinal area.  Musculoskeletal:        General: No swelling or tenderness. Normal range of motion.     Cervical back: Normal range of motion and neck supple.       Back:  Lymphadenopathy:     Cervical: No cervical adenopathy.     Lower Body: No right inguinal adenopathy. No left inguinal adenopathy.  Neurological:     General: No focal deficit present.     Mental Status: She is alert and oriented to person, place, and time.     Cranial Nerves: No cranial nerve deficit.  Skin:    General: Skin is warm and dry.     Findings: No erythema or rash.  Psychiatric:        Mood and Affect: Mood normal.        Behavior: Behavior normal.         Judgment: Judgment normal.     Female chaperone present for pelvic and breast  portions of the physical exam  Results: AUDIT Questionnaire (screen for alcoholism): 6 PHQ-9: 3   Assessment: 35 y.o. G15P0030 female here for routine annual gynecologic examination  Plan:  Problem List Items Addressed This Visit   None   Visit Diagnoses    Women's annual routine gynecological examination    -  Primary   Relevant Medications   Norgestimate-Ethinyl Estradiol Triphasic (TRI-SPRINTEC) 0.18/0.215/0.25 MG-35 MCG tablet   Screening for depression       Screening for alcoholism       Encounter for surveillance of contraceptive pills       Relevant Medications   Norgestimate-Ethinyl Estradiol Triphasic (TRI-SPRINTEC) 0.18/0.215/0.25 MG-35 MCG tablet   Chronic right-sided low back pain with right-sided sciatica       Relevant Orders   Ambulatory referral to Physical Therapy      Screening: -- Blood pressure screen elevated: continued to monitor. -- Weight screening: overweight: continue to monitor -- Depression screening negative (PHQ-9) -- Nutrition: normal -- cholesterol screening: not due for screening -- osteoporosis screening: not due -- tobacco screening: not using -- alcohol screening: AUDIT questionnaire indicates low-risk usage. -- family history of breast cancer screening: done. not at high risk. -- no evidence of domestic violence or intimate partner violence. -- STD screening: gonorrhea/chlamydia NAAT not collected per patient request. -- pap smear not collected per ASCCP guidelines -- flu vaccine declined  Thomasene Mohair, MD 12/17/2020 2:03 PM

## 2020-12-26 DIAGNOSIS — M9903 Segmental and somatic dysfunction of lumbar region: Secondary | ICD-10-CM | POA: Diagnosis not present

## 2020-12-26 DIAGNOSIS — M9902 Segmental and somatic dysfunction of thoracic region: Secondary | ICD-10-CM | POA: Diagnosis not present

## 2020-12-26 DIAGNOSIS — M5441 Lumbago with sciatica, right side: Secondary | ICD-10-CM | POA: Diagnosis not present

## 2020-12-26 DIAGNOSIS — M5414 Radiculopathy, thoracic region: Secondary | ICD-10-CM | POA: Diagnosis not present

## 2020-12-27 DIAGNOSIS — M5414 Radiculopathy, thoracic region: Secondary | ICD-10-CM | POA: Diagnosis not present

## 2020-12-27 DIAGNOSIS — M9902 Segmental and somatic dysfunction of thoracic region: Secondary | ICD-10-CM | POA: Diagnosis not present

## 2020-12-27 DIAGNOSIS — M9903 Segmental and somatic dysfunction of lumbar region: Secondary | ICD-10-CM | POA: Diagnosis not present

## 2020-12-27 DIAGNOSIS — M5441 Lumbago with sciatica, right side: Secondary | ICD-10-CM | POA: Diagnosis not present

## 2021-01-06 DIAGNOSIS — M9903 Segmental and somatic dysfunction of lumbar region: Secondary | ICD-10-CM | POA: Diagnosis not present

## 2021-01-06 DIAGNOSIS — M9902 Segmental and somatic dysfunction of thoracic region: Secondary | ICD-10-CM | POA: Diagnosis not present

## 2021-01-06 DIAGNOSIS — M5414 Radiculopathy, thoracic region: Secondary | ICD-10-CM | POA: Diagnosis not present

## 2021-01-06 DIAGNOSIS — M5441 Lumbago with sciatica, right side: Secondary | ICD-10-CM | POA: Diagnosis not present

## 2021-01-09 DIAGNOSIS — M5441 Lumbago with sciatica, right side: Secondary | ICD-10-CM | POA: Diagnosis not present

## 2021-01-09 DIAGNOSIS — M9903 Segmental and somatic dysfunction of lumbar region: Secondary | ICD-10-CM | POA: Diagnosis not present

## 2021-01-09 DIAGNOSIS — M5414 Radiculopathy, thoracic region: Secondary | ICD-10-CM | POA: Diagnosis not present

## 2021-01-09 DIAGNOSIS — M9902 Segmental and somatic dysfunction of thoracic region: Secondary | ICD-10-CM | POA: Diagnosis not present

## 2021-01-13 DIAGNOSIS — M5414 Radiculopathy, thoracic region: Secondary | ICD-10-CM | POA: Diagnosis not present

## 2021-01-13 DIAGNOSIS — M5441 Lumbago with sciatica, right side: Secondary | ICD-10-CM | POA: Diagnosis not present

## 2021-01-13 DIAGNOSIS — M9903 Segmental and somatic dysfunction of lumbar region: Secondary | ICD-10-CM | POA: Diagnosis not present

## 2021-01-13 DIAGNOSIS — M9902 Segmental and somatic dysfunction of thoracic region: Secondary | ICD-10-CM | POA: Diagnosis not present

## 2021-01-16 DIAGNOSIS — M5441 Lumbago with sciatica, right side: Secondary | ICD-10-CM | POA: Diagnosis not present

## 2021-01-16 DIAGNOSIS — M5414 Radiculopathy, thoracic region: Secondary | ICD-10-CM | POA: Diagnosis not present

## 2021-01-16 DIAGNOSIS — M9902 Segmental and somatic dysfunction of thoracic region: Secondary | ICD-10-CM | POA: Diagnosis not present

## 2021-01-16 DIAGNOSIS — M9903 Segmental and somatic dysfunction of lumbar region: Secondary | ICD-10-CM | POA: Diagnosis not present

## 2021-01-20 DIAGNOSIS — M9903 Segmental and somatic dysfunction of lumbar region: Secondary | ICD-10-CM | POA: Diagnosis not present

## 2021-01-20 DIAGNOSIS — M5414 Radiculopathy, thoracic region: Secondary | ICD-10-CM | POA: Diagnosis not present

## 2021-01-20 DIAGNOSIS — M5441 Lumbago with sciatica, right side: Secondary | ICD-10-CM | POA: Diagnosis not present

## 2021-01-20 DIAGNOSIS — M9902 Segmental and somatic dysfunction of thoracic region: Secondary | ICD-10-CM | POA: Diagnosis not present

## 2021-01-21 ENCOUNTER — Ambulatory Visit: Payer: BC Managed Care – PPO | Attending: Obstetrics and Gynecology | Admitting: Physical Therapy

## 2021-01-21 DIAGNOSIS — G8929 Other chronic pain: Secondary | ICD-10-CM | POA: Insufficient documentation

## 2021-01-21 DIAGNOSIS — M5441 Lumbago with sciatica, right side: Secondary | ICD-10-CM | POA: Insufficient documentation

## 2021-01-21 DIAGNOSIS — M546 Pain in thoracic spine: Secondary | ICD-10-CM | POA: Insufficient documentation

## 2021-01-21 DIAGNOSIS — M62838 Other muscle spasm: Secondary | ICD-10-CM | POA: Insufficient documentation

## 2021-01-21 DIAGNOSIS — M25511 Pain in right shoulder: Secondary | ICD-10-CM | POA: Insufficient documentation

## 2021-01-21 DIAGNOSIS — M25551 Pain in right hip: Secondary | ICD-10-CM | POA: Insufficient documentation

## 2021-01-22 ENCOUNTER — Telehealth: Payer: Self-pay | Admitting: Physical Therapy

## 2021-01-22 NOTE — Telephone Encounter (Signed)
Called patient after she did not show up for her 4pm appointment yesterday. She stated she forgot but that 4pm was not a good time for her. Rescheduled initial eval Tuesday 01/28/2021 at 2:30pm. Micah Flesher over with her how to get to clinic. She said she also sees a chiropractor and may need to cancel that appointment but that she would let us know in advance as soon as she knew if she needed to cancel.   Requested office staff schedule the appointment.   Luretha Murphy. Ilsa Iha, PT, DPT 01/22/21, 11:11 AM

## 2021-01-23 DIAGNOSIS — M9902 Segmental and somatic dysfunction of thoracic region: Secondary | ICD-10-CM | POA: Diagnosis not present

## 2021-01-23 DIAGNOSIS — M5441 Lumbago with sciatica, right side: Secondary | ICD-10-CM | POA: Diagnosis not present

## 2021-01-23 DIAGNOSIS — M9903 Segmental and somatic dysfunction of lumbar region: Secondary | ICD-10-CM | POA: Diagnosis not present

## 2021-01-23 DIAGNOSIS — M5414 Radiculopathy, thoracic region: Secondary | ICD-10-CM | POA: Diagnosis not present

## 2021-01-27 DIAGNOSIS — M9903 Segmental and somatic dysfunction of lumbar region: Secondary | ICD-10-CM | POA: Diagnosis not present

## 2021-01-27 DIAGNOSIS — M5414 Radiculopathy, thoracic region: Secondary | ICD-10-CM | POA: Diagnosis not present

## 2021-01-27 DIAGNOSIS — M5441 Lumbago with sciatica, right side: Secondary | ICD-10-CM | POA: Diagnosis not present

## 2021-01-27 DIAGNOSIS — M9902 Segmental and somatic dysfunction of thoracic region: Secondary | ICD-10-CM | POA: Diagnosis not present

## 2021-01-28 ENCOUNTER — Encounter: Payer: BC Managed Care – PPO | Admitting: Physical Therapy

## 2021-01-28 ENCOUNTER — Other Ambulatory Visit: Payer: Self-pay

## 2021-01-28 ENCOUNTER — Ambulatory Visit: Payer: BC Managed Care – PPO | Admitting: Physical Therapy

## 2021-01-28 ENCOUNTER — Encounter: Payer: Self-pay | Admitting: Physical Therapy

## 2021-01-28 DIAGNOSIS — M25511 Pain in right shoulder: Secondary | ICD-10-CM | POA: Diagnosis not present

## 2021-01-28 DIAGNOSIS — M62838 Other muscle spasm: Secondary | ICD-10-CM | POA: Diagnosis not present

## 2021-01-28 DIAGNOSIS — M546 Pain in thoracic spine: Secondary | ICD-10-CM | POA: Diagnosis not present

## 2021-01-28 DIAGNOSIS — M25551 Pain in right hip: Secondary | ICD-10-CM | POA: Diagnosis not present

## 2021-01-28 DIAGNOSIS — G8929 Other chronic pain: Secondary | ICD-10-CM

## 2021-01-28 DIAGNOSIS — M5441 Lumbago with sciatica, right side: Secondary | ICD-10-CM | POA: Diagnosis not present

## 2021-01-28 NOTE — Therapy (Signed)
[facility] PHYSICAL AND SPORTS MEDICINE [address] [place], [place], [address] Phone: [phone]   Fax:  [phone]  Physical Therapy Evaluation  Patient Details  Name: Desiree Rivera MRN: [id number] Date of Birth: 1986-05-12 Referring Provider (PT): Conard NovakJackson, Stephen D, MD (OBGYN)   Encounter Date: 01/28/2021   PT End of Session - 01/28/21 1900    Visit Number 1    Number of Visits 24    Date for PT Re-Evaluation 04/22/21    Authorization Type [employer] reporting period from 01/28/2021    Progress Note Due on Visit 10    PT Start Time 1430    PT Stop Time 1510    PT Time Calculation (min) 40 min    Activity Tolerance Patient tolerated treatment well    Behavior During Therapy [facility] for tasks assessed/performed           Past Medical History:  Diagnosis Date  . Abnormal Pap smear of cervix   . CIN II (cervical intraepithelial neoplasia II)   . Migraine     Past Surgical History:  Procedure Laterality Date  . LEEP    . TONSILLECTOMY    . WISDOM TOOTH EXTRACTION      There were no vitals filed for this visit.    Subjective Assessment - 01/28/21 1449    Subjective Patient state she thinks her condition may be a collection of things. I started with gradual onset for no apparent reason about 7 months ago. She is an avid runner/exerciser and usually trains 5 days a week: Running (1 hour) weight lifting with free weights and resistance bands (30-45 min), and long stretch routine after (5-10 min). She has a history of R sciatic pain on and off that has been treated successfully 2 times with steroid taper in the last 3 years. 7 months ago she started feeling intense pain in the right side of her body that stopped her from exercising like before leading to weight gain which is also distressing to her. 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 She is in and out of a car about 10 times a day and is always moving her heavy  bag in and out of the car with her right UE. She does a lot of reaching, deep squatting, squat walking, lifting, pushing and pulling for her work. There was a 2 week period where she could not lift at work but now she can continue to complete her duties with some adaptations to the pain she has. She reports the pain stays between 4/10 and 10/10. It changes in intensity a lot without a strong pattern and the location of worst pain also moves. She has a spot in her right thoracic spine that is very painful and radiates intermittently in a burning sensation around her ribs to anterior rib cage under her bra line. She also has intermittent posterior R shoulder pain. Also has intense pain along the right lateral hip and glute region with intermittent pain to the R groin (gestures with C-sign). Also reports pain just superior to iliac crest on the right side. Reports pain goes down the lateral R hip to the knee but usually stays in proximal half of thigh. Reports prior sciatica usually was in posterior thigh. She has been seeing a chiropractor for 5 weeks 2x a week. He thinks she has a problem with floating rib and pinched nerve but is unsure why it is not staying better. She  states he has told her he feels she has two separate problems in the low back and thoracic spine. When she coughs she feels pain in the right shoulder. No history of R shoulder problems or thoracic problems. Does say R shoulder has consistently popped for last 5 years. Also notes popping in R hip for years but reports it is at the posterior hip when she gets out of the "butterfly" position (flexed and ER). Also notes cramping sensation at anterior R hip with prolonged sitting that makes it hard to get up but does not appear to report locking or pseudo locking. Massage therapist has said her piriformis and hip flexor is tight. She hasn't been for a while so she did not have as many people telling her different things.  All areas of pain gets worse and  better at the same time. Has been using ice. Used to use heat and felt really good but Chiro recommended not due to possible increase in inflammation. She used to stretch and do exercises but is doing less because chiro told her stretches can make things worse.  Feels like in general she is getting better but condition continues to fluctuate. Went to [employer] yesterday. Feels pretty good today but just got done lifing a bunch of stuff. Sunday felt like she was dying. Just got a new matress. Thinks the old mattress may have been 20% of the problem. Reports for this episode steroids did not help, neither did 3 days of heavy ibuprofen use. Cannot lay on stomach to sleep (feels like lower back gets stiff). Chiro treatment has been with TENS, manipulations to the thoracic spine, some things to the R hip but minimal popping. Looked at her neck. She states he said he can "feel inflammation" in her muscles.  He gave her an exercise yesterday (cat-camel with little booket of exercises to strengthen thoracic spine). Patient reports within -session improvements that does not stay better which chiro not satisfied with and is unsure why. Notes she clenches her teeth and carries stress in her B UT; has TMD. PCP suggested going to orthopedist as a next step after PT if this does not help. She would rather resolve it without that. Urinalysis was fine. No MRI at this point. No other symptoms as far as nausea bowel/bladder, no fever, no association with eating.   Pertinent History Patient is a 35 y.o. female who presents to outpatient physical therapy with a referral for medical diagnosis chronic right-sided low back pain with right-sided sciatica. This patient's chief complaints consist of right sided thoracic spine, rib, shoulder, low back, hip, and leg pain proximal to knee that came on gradually ~ 7 months ago and is constant in nature but intermittent in intensity leading to the following functional deficits:  difficulty with or  unable to perform usual activities including sleeping, working, bending, squatting, pushing pulling, sitting, getting in and out of car, running, 5x a week exercise routing including running, lifting, and stretching. She states it has also lead to weight gain which distresses her. Relevant past medical history and comorbidities include tension headaches, migraines, skin cancer removed when 35 years old.  Patient denies hx of stroke, seizures, lung problem, major cardiac events, diabetes, unexplained weight loss, changes in bowel or bladder problems, new onset stumbling or dropping things, spinal surgeries.    Limitations Sitting;Lifting;Standing;Walking;House hold activities;Other (comment)   difficulty with or unable to perform usual activities including sleeping, working, bending, squatting, pushing pulling, sitting, getting in and out of car,  running, 5x a week exercise routing including running, lifting, and stretching.   Diagnostic tests radiograph report for lumbar and thoracic spine and right hip 10/15/2020: "IMPRESSION:  Minimal midthoracic levoconvex scoliosis. Otherwise negative exam."    Patient Stated Goals to get back to usual activities without pain    Currently in Pain? Yes    Pain Score 4    B: 4/10; W: 10/10   Pain Location Other (Comment)   R T-spine radiates around her ribs to anterior rib cage under her bra line. intermittent posterior R shoulder, R lateral hip and glute region, R groin (gestures with C-sign). R iliac crest to lateral R hip to the knee   Pain Orientation Right    Pain Descriptors / Indicators Burning;Constant;Cramping;Radiating;Throbbing;Tingling    Pain Radiating Towards R ribs under bra line. R lateral thigh  not distal to knee, mostly proximal half of thigh. Notes occasional paresthesia "zinging" down R leg when she massages R buttocks.    Pain Onset More than a month ago    Pain Frequency Constant   intensity fluxuates   Aggravating Factors  lifting, squatting,  prolonged sitting, mornings, pushing/pulling R hand, standing, acceleration, shifting onto R hip or L hip.    Pain Relieving Factors ice, heat (chiropractor asked her not to due to inflammation concerns), standing up and improved posture,    Effect of Pain on Daily Activities Functional Limitations: difficulty with or unable to perform usual activities including sleeping, working, bending, squatting, pushing pulling, sitting, getting in and out of car, running, 5x a week exercise routing including running, lifting, and stretching.             [facility] PT Assessment - 01/28/21 1504      Assessment   Medical Diagnosis chronic right-sided low back pain with right-sided sciatica    Referring Provider (PT) Conard Novak, MD (OBGYN)    Onset Date/Surgical Date 06/30/20    Hand Dominance Right    Prior Therapy none for this problem prior to current episode of care, has been seeing chiropractor for 5 weeks with within visit improvements      Precautions   Precautions None      Restrictions   Weight Bearing Restrictions No      Balance Screen   Has the patient fallen in the past 6 months No    Has the patient had a decrease in activity level because of a fear of falling?  No    Is the patient reluctant to leave their home because of a fear of falling?  No      Home Environment   Living Environment --   no concerns about getting around living environment.     Prior Function   Level of Independence Independent   Exercise 5 days a week: Running (1 hour) weight lifting with free weights and resistance bands (30-45 min), and long stretch routine after (5-10 min).   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    Leisure working out, Artist Status Within Functional Limits for  tasks assessed              OBJECTIVE  OBSERVATION/INSPECTION . Posture: notable strong hourglass shape with small waist and wide hips. Generally symmetrical, normal lumbar curve,  no rib hump in spinal flexion. Mildly rounded shoulders, minimally slumped posture and forward head.  . Tremor: none . Muscle bulk: Generally WFL and appears to have good muscle bulk appropriate for build.  . Skin: WFL as visualized.  . Bed mobility: deferred . Transfers: sit <> stand I except painful . Gait: grossly WFL for household and short community ambulation. More detailed gait analysis deferred to later date as needed.    SPINE MOTION  Cervical Spine AROM WFL all directions and no reproduction of symptoms with overpressure.   Lumbar AROM *Indicates pain - Flexion: = fingers to toes, end range pain at right glute and upper back. . - Extension: = 50% pain end range pain at belt line and upper abdominals.  - Rotation: R = WFL, L = equal motion but, pain over right thoracic and glutes. - Side Flexion: R = pain at QL region, L = pulling at R thoracic spine.  PERIPHERAL JOINT MOTION (in degrees) AROM B UE appear WFL with no reproduction of symptoms.  B LE appear grossly [facility] for basic mobility with pain with R hip motion. More detailed assessment deferred to later date.   MUSCLE PERFORMANCE (MMT):  B UE appear WFL except less confident R > L resistance and R thoracic pain with resisted R shoulder flexion, abduction, IR.  B LE appears [facility] for basic mobility except R LE painful with motion. More detailed assessment deferred to later date.   REPEATED MOTIONS TESTING: Motion/Technique sets x reps During After  Thoracic extension over back of chair  1x10 End rage pain No worse   ACCESSORY MOTION:  - Deferred this date  PALPATION: - Deferred this date  EDUCATION/COGNITION: Patient is alert and oriented X 4.  Objective measurements completed on examination: See above findings.      PT  Education - 01/28/21 1859    Education Details oriented to session.  Self management techniques. Education on diagnosis, prognosis, POC, anatomy and physiology of current condition    Person(s) Educated Patient    Methods Explanation;Demonstration;Tactile cues;Verbal cues    Comprehension Returned demonstration;Verbal cues required;Tactile cues required;Need further instruction;Verbalized understanding            PT Short Term Goals - 01/28/21 1916      PT SHORT TERM GOAL #1   Title Be independent with initial home exercise program for self-management of symptoms.    Baseline to be established at visit 2 (01/28/2021);    Time 3    Period Weeks    Status New    Target Date 02/18/21             PT Long Term Goals - 01/28/21 1917      PT LONG TERM GOAL #1   Title Be independent with a long-term home exercise program for self-management of symptoms.    Baseline Initial HEP to be established at visit 2 (01/28/2021);    Time 12    Period Weeks    Status New   TARGET DATE FOR ALL LONG TERM GOALS: 04/22/2021     PT LONG TERM GOAL #2   Title Demonstrate improved FOTO score by 10 units to demonstrate improvement in overall condition and self-reported functional ability.    Baseline to be measured visit 2 (01/28/2021);    Time 12    Period Weeks    Status New      PT LONG TERM GOAL #3   Title Have full thoracic and lumbar AROM with no compensations or increase  in pain in all planes except intermittent end range discomfort to allow patient to complete valued activities with less difficulty.    Baseline painful and limited - see objective exam (01/28/2021);    Time 12    Period Weeks    Status New      PT LONG TERM GOAL #4   Title Reduce pain with functional activities to equal or less than 2/10 to allow patient to complete usual activities including ADLs, IADLs, and social engagement with less difficulty.    Baseline 10/10 (01/28/2021);    Time 12    Period Weeks    Status New      PT  LONG TERM GOAL #5   Title Complete community, work and/or recreational activities without limitation due to current condition    Baseline Functional Limitations: difficulty with or unable to perform usual activities including sleeping, working, bending, squatting, pushing pulling, sitting, getting in and out of car, running, 5x a week exercise routing including running, lifting, and stretching (01/28/2021);    Time 12    Period Weeks    Status New                  Plan - 01/28/21 1913    Clinical Impression Statement Patient is a 35 y.o. female referred to outpatient physical therapy with a medical diagnosis of chronic right-sided low back pain with right-sided sciatica who presents with signs and symptoms consistent with complex presentation of right thoracic spine pain with radiation to right rib cage and scapular region, low back pain with radiation to R leg and possible hip dysfunction and pain. Unable to complete as thorough objective examination the session as desired due to limited time and the complexity of history and presentation. Will continue to assess and narrow down contributors to condition at subsequent sessions. Patient's pain is more widespread than expected for just thoracic or lumbar spine pain and portions of subjective exam implicates hip pathology as well.  Patient presents with significant pain, ROM, joint stiffness, muscle tightness, paresthesia, muscle performance (strength/endurance/power), and activity tolerance impairments that are limiting ability to complete her usual activities including sleeping, working, bending, squatting, pushing pulling, sitting, getting in and out of car, running, 5x a week exercise routing including running, lifting, and stretching  without difficulty. Patient will benefit from skilled physical therapy intervention to address current body structure impairments and activity limitations to improve function and work towards goals set in current POC  in order to return to prior level of function or maximal functional improvement.    Personal Factors and Comorbidities Age;Comorbidity 3+;Profession;Past/Current Experience;Fitness;Time since onset of injury/illness/exacerbation    Comorbidities Relevant past medical history and comorbidities include tension headaches, migraines, skin cancer removed when 35 years old.    Examination-Activity Limitations Bed Mobility;Lift;Stairs;Squat;Bend;Locomotion Level;Stand;Reach Overhead;Carry;Transfers;Sit;Sleep    Examination-Participation Restrictions Laundry;Cleaning;Community Activity;Occupation;Driving;Interpersonal Relationship   difficulty with or unable to perform usual activities including sleeping, working, bending, squatting, pushing pulling, sitting, getting in and out of car, running, 5x a week exercise routing including running, lifting, and stretching.   Stability/Clinical Decision Making Evolving/Moderate complexity    Clinical Decision Making Moderate    Rehab Potential Good    PT Frequency 2x / week    PT Duration 12 weeks    PT Treatment/Interventions ADLs/Self Care Home Management;Aquatic Therapy;Cryotherapy;Moist Heat;Electrical Stimulation;Neuromuscular re-education;Therapeutic exercise;Therapeutic activities;Patient/family education;Manual techniques;Dry needling;Passive range of motion;Joint Manipulations;Spinal Manipulations;Taping    PT Next Visit Plan More thorough exam of lumbar spine and lower quarter, establish HEP as appropriate  PT Home Exercise Plan TBD    Consulted and Agree with Plan of Care Patient           Patient will benefit from skilled therapeutic intervention in order to improve the following deficits and impairments:  Increased fascial restricitons,Impaired sensation,Pain,Decreased mobility,Increased muscle spasms,Decreased activity tolerance,Decreased endurance,Decreased range of motion,Decreased strength,Hypomobility,Impaired perceived functional  ability,Impaired UE functional use,Difficulty walking  Visit Diagnosis: Chronic right-sided low back pain with right-sided sciatica  Pain in thoracic spine  Pain in right hip  Chronic right shoulder pain  Other muscle spasm     Problem List Patient Active Problem List   Diagnosis Date Noted  . Personal history of other diseases of the female genital tract 11/25/2016  . History of other malignant neoplasm of skin 11/25/2016  . Allergic rhinitis 11/25/2016  . Cephalalgia 04/20/2006    Luretha Murphy. Ilsa Iha, PT, DPT 01/28/21, 7:22 PM   [facility] PHYSICAL AND SPORTS MEDICINE [address] [place], [address] Phone: [phone]   Fax:  [phone]  Name: Desiree Rivera MRN: [id number] Date of Birth: 06-Apr-1986

## 2021-01-30 ENCOUNTER — Other Ambulatory Visit: Payer: Self-pay

## 2021-01-30 ENCOUNTER — Encounter: Payer: BC Managed Care – PPO | Admitting: Physical Therapy

## 2021-01-30 ENCOUNTER — Ambulatory Visit: Payer: BC Managed Care – PPO | Admitting: Physical Therapy

## 2021-01-30 ENCOUNTER — Encounter: Payer: Self-pay | Admitting: Physical Therapy

## 2021-01-30 DIAGNOSIS — M546 Pain in thoracic spine: Secondary | ICD-10-CM | POA: Diagnosis not present

## 2021-01-30 DIAGNOSIS — M5441 Lumbago with sciatica, right side: Secondary | ICD-10-CM | POA: Diagnosis not present

## 2021-01-30 DIAGNOSIS — M9902 Segmental and somatic dysfunction of thoracic region: Secondary | ICD-10-CM | POA: Diagnosis not present

## 2021-01-30 DIAGNOSIS — M5414 Radiculopathy, thoracic region: Secondary | ICD-10-CM | POA: Diagnosis not present

## 2021-01-30 DIAGNOSIS — M25511 Pain in right shoulder: Secondary | ICD-10-CM | POA: Diagnosis not present

## 2021-01-30 DIAGNOSIS — M62838 Other muscle spasm: Secondary | ICD-10-CM

## 2021-01-30 DIAGNOSIS — G8929 Other chronic pain: Secondary | ICD-10-CM | POA: Diagnosis not present

## 2021-01-30 DIAGNOSIS — M9903 Segmental and somatic dysfunction of lumbar region: Secondary | ICD-10-CM | POA: Diagnosis not present

## 2021-01-30 DIAGNOSIS — M25551 Pain in right hip: Secondary | ICD-10-CM

## 2021-01-30 NOTE — Therapy (Signed)
[place] [facility]IC[facility] [address], [place], [address] Phone: [phone]   Fax:  [phone]  Physical Therapy Treatment  Patient Details  Name: Desiree Rivera MRN: [id number] Date of Birth: [birth year] Referring Provider (PT): Conard Novak, MD (OBGYN)   Encounter Date: 01/30/2021   PT End of Session - 01/30/21 1832    Visit Number 2    Number of Visits 24    Date for PT Re-Evaluation 04/22/21    Authorization Type [employer] reporting period from 01/28/2021    Progress Note Due on Visit 10    PT Start Time 1605    PT Stop Time 1643    PT Time Calculation (min) 38 min    Activity Tolerance Patient tolerated treatment well    Behavior During Therapy [facility] for tasks assessed/performed           Past Medical History:  Diagnosis Date  . Abnormal Pap smear of cervix   . CIN II (cervical intraepithelial neoplasia II)   . Migraine     Past Surgical History:  Procedure Laterality Date  . LEEP    . TONSILLECTOMY    . WISDOM TOOTH EXTRACTION      There were no vitals filed for this visit.   Subjective Assessment - 01/30/21 1607    Subjective Patient reports she is actually feeling better this week than she has for several weeks. She did take a couple of doses of ibupforen and used her heating pad. Thinks her pain has gone down about 80% since she was last here. Was not flaired up after last session. Just saw chicorpractor today and got some good pops at the thoracic spine. Feels her pain mostly when sitting down in the thoracic spine or shifting right hip. Currently most pain is in right thoracic spine low back glute and right hip. Not down leg.    Pertinent History Patient is a 35 y.o. female who presents to outpatient physical therapy with a referral for medical diagnosis chronic right-sided low back pain with right-sided sciatica. This patient's chief complaints consist of right sided thoracic spine, rib, shoulder,  low back, hip, and leg pain proximal to knee that came on gradually ~ 7 months ago and is constant in nature but intermittent in intensity leading to the following functional deficits:  difficulty with or unable to perform usual activities including sleeping, working, bending, squatting, pushing pulling, sitting, getting in and out of car, running, 5x a week exercise routing including running, lifting, and stretching. She states it has also lead to weight gain which distresses her. Relevant past medical history and comorbidities include tension headaches, migraines, skin cancer removed when 35 years old.  Patient denies hx of stroke, seizures, lung problem, major cardiac events, diabetes, unexplained weight loss, changes in bowel or bladder problems, new onset stumbling or dropping things, spinal surgeries.    Limitations Sitting;Lifting;Standing;Walking;House hold activities;Other (comment)   difficulty with or unable to perform usual activities including sleeping, working, bending, squatting, pushing pulling, sitting, getting in and out of car, running, 5x a week exercise routing including running, lifting, and stretching.   Diagnostic tests radiograph report for lumbar and thoracic spine and right hip 10/15/2020: "IMPRESSION:  Minimal midthoracic levoconvex scoliosis. Otherwise negative exam."    Patient Stated Goals to get back to usual activities without pain    Currently in Pain? Yes    Pain Score 2     Pain Onset More than a month ago  OBJECTIVE  NEUROLOGICAL Dermatomes . C2-T1 appears equal and intact to light touch.  . L4-L5 appears equal and intact to light touch. L2-L3 and S2-S2 with more feeling on R > L. Myotomes . C2-T1 appears intact . L2-S2 appears intact Deep Tendon Reflexes R/L  . 0+/0+ Quadriceps reflex (L4) . 1+/1+ Achilles reflex (S1) Neurodynamic Tests    Straight leg raise (SLR): R = positive for "sciatic" pain not concordant for current pain, L = positive  for non-painful tesion  PERIPHERAL JOINT MOTION (in degrees) B LE PROM WFL bilaterally except painful at R hip. Noted for more than average hip ER bilaterally.   MUSCLE PERFORMANCE (MMT):  *Indicates pain 01/30/21 Date Date  Joint/Motion R/L R/L R/L  Shoulder     Hip     Flexion 4*/5 / /  Extension (knee ext) 5*/5 / /  Abduction 5*/5 / /  Adduction / / /  Knee     Extension 5*/5 / /  Flexion 5*/5 / /  Comments: B ankle and great toe extension 5/5 bilaterally  REPEATED MOTIONS TESTING: - Prone press up with hands near face (to preferentially bias thoracic spine) 1x10 increasing R scapular pain and shoulder pain, worse in these regions following (unclear if it is related to UE pressure on mat).  - standing lumbar extension with hands on low back, 1x10. No clear worsening or improvement. Increase, no worse at low back.  SPECIAL TESTS: Straight leg raise (SLR): R = positive for "sciatic" pain not concordant for current pain, L = positive for non-painful tesion.  FABER: R = positive for groin pain concordant, L = stretch at anterior hip.  FADDIR: R positive for pain at groin and posterior hip Dynamic FADDIR/FABER: IR to ER painful popping.   ACCESSORY MOTION:  - CPA to thoracic spine from approximately T9 through T12 reproduces R sided rib pain - CPA to lumbar spine reproduces R sided lumbar and glute/hip pain worst at lowest levels. CPA to sacrum not tender.  - Spring testing to lateral R ribs exceptionally tender in axilla and about rib 8 level.   PALPATION: - TTP palpation and tightness in right axilla, muscles of right thorax, thoracic and lumbar paraspinals, QL, right glute max/med/min, deep external rotators, QF.   EDUCATION/COGNITION: Patient is alert and oriented X 4.  TREATMENT:  Therapeutic exercise: to centralize symptoms and improve ROM, strength, muscular endurance, and activity tolerance required for successful completion of functional activities.  - continued more  detailed objective examination to to determine best course of treatment (see above).  - prone press up with hands near face 1x10 - standing lumbar extension 1x10 - Education on HEP including handout   Manual therapy: to reduce pain and tissue tension, improve range of motion, neuromodulation, in order to promote improved ability to complete functional activities. - STM to lumbar and thoracic paraspinals, R lumbar QL, R glutes, posterior hip, external rotators.  - CPA to thoracic and lumbar spine grade II-IV   HOME EXERCISE PROGRAM  Access Code: [email or web address] URL: [email or web address] Date: 01/30/2021 Prepared by: Norton BlizzardSara Keyoni Lapinski  Exercises Seated Thoracic Lumbar Extension - 2 x daily - 2 sets - 10 reps Seated Thoracic Extension with Hands Behind Neck - 2 x daily - 2 sets - 10 reps Standing Lumbar Extension - 2 x daily - 1 sets - 10 reps     PT Education - 01/30/21 1832    Education Details Self management techniques. Education on diagnosis, prognosis    Person(s) Educated Patient  Methods Explanation;Demonstration;Tactile cues;Verbal cues;Handout    Comprehension Verbalized understanding;Returned demonstration;Verbal cues required;Tactile cues required;Need further instruction            PT Short Term Goals - 01/28/21 1916      PT SHORT TERM GOAL #1   Title Be independent with initial home exercise program for self-management of symptoms.    Baseline to be established at visit 2 (01/28/2021);    Time 3    Period Weeks    Status New    Target Date 02/18/21             PT Long Term Goals - 01/28/21 1917      PT LONG TERM GOAL #1   Title Be independent with a long-term home exercise program for self-management of symptoms.    Baseline Initial HEP to be established at visit 2 (01/28/2021);    Time 12    Period Weeks    Status New   TARGET DATE FOR ALL LONG TERM GOALS: 04/22/2021     PT LONG TERM GOAL #2   Title Demonstrate improved FOTO score by 10 units to  demonstrate improvement in overall condition and self-reported functional ability.    Baseline to be measured visit 2 (01/28/2021);    Time 12    Period Weeks    Status New      PT LONG TERM GOAL #3   Title Have full thoracic and lumbar AROM with no compensations or increase in pain in all planes except intermittent end range discomfort to allow patient to complete valued activities with less difficulty.    Baseline painful and limited - see objective exam (01/28/2021);    Time 12    Period Weeks    Status New      PT LONG TERM GOAL #4   Title Reduce pain with functional activities to equal or less than 2/10 to allow patient to complete usual activities including ADLs, IADLs, and social engagement with less difficulty.    Baseline 10/10 (01/28/2021);    Time 12    Period Weeks    Status New      PT LONG TERM GOAL #5   Title Complete community, work and/or recreational activities without limitation due to current condition    Baseline Functional Limitations: difficulty with or unable to perform usual activities including sleeping, working, bending, squatting, pushing pulling, sitting, getting in and out of car, running, 5x a week exercise routing including running, lifting, and stretching (01/28/2021);    Time 12    Period Weeks    Status New                 Plan - 01/30/21 1831    Clinical Impression Statement Patient tolerated treatment well overall but did experience some difficulty with increased pain in R shoulder after prone press ups. Patient appears to have diffuse pain over the right thorax, lumbar spine, and anterior/posterior/anterior hip. Does have positive special test for hip dysfunction but may be false positive due to being positive for referral of symptoms from back with accessory testing in that region. Is doing better today compared to initial eval and thoracic extension was the only exercise completed last session so prescribed similar today to test effect over the  next few days. Patient would benefit from continued management of limiting condition by skilled physical therapist to address remaining impairments and functional limitations to work towards stated goals and return to PLOF or maximal functional independence.    Personal Factors and  Comorbidities Age;Comorbidity 3+;Profession;Past/Current Experience;Fitness;Time since onset of injury/illness/exacerbation    Comorbidities Relevant past medical history and comorbidities include tension headaches, migraines, skin cancer removed when 35 years old.    Examination-Activity Limitations Bed Mobility;Lift;Stairs;Squat;Bend;Locomotion Level;Stand;Reach Overhead;Carry;Transfers;Sit;Sleep    Examination-Participation Restrictions Laundry;Cleaning;Community Activity;Occupation;Driving;Interpersonal Relationship   difficulty with or unable to perform usual activities including sleeping, working, bending, squatting, pushing pulling, sitting, getting in and out of car, running, 5x a week exercise routing including running, lifting, and stretching.   Stability/Clinical Decision Making Evolving/Moderate complexity    Rehab Potential Good    PT Frequency 2x / week    PT Duration 12 weeks    PT Treatment/Interventions ADLs/Self Care Home Management;Aquatic Therapy;Cryotherapy;Moist Heat;Electrical Stimulation;Neuromuscular re-education;Therapeutic exercise;Therapeutic activities;Patient/family education;Manual techniques;Dry needling;Passive range of motion;Joint Manipulations;Spinal Manipulations;Taping    PT Next Visit Plan manual therapy and exercise as tolerated    PT Home Exercise Plan TBD    Consulted and Agree with Plan of Care Patient           Patient will benefit from skilled therapeutic intervention in order to improve the following deficits and impairments:  Increased fascial restricitons,Impaired sensation,Pain,Decreased mobility,Increased muscle spasms,Decreased activity tolerance,Decreased  endurance,Decreased range of motion,Decreased strength,Hypomobility,Impaired perceived functional ability,Impaired UE functional use,Difficulty walking  Visit Diagnosis: Chronic right-sided low back pain with right-sided sciatica  Pain in thoracic spine  Pain in right hip  Chronic right shoulder pain  Other muscle spasm     Problem List Patient Active Problem List   Diagnosis Date Noted  . Personal history of other diseases of the female genital tract 11/25/2016  . History of other malignant neoplasm of skin 11/25/2016  . Allergic rhinitis 11/25/2016  . Cephalalgia 04/20/2006    Luretha Murphy. Ilsa Iha, PT, DPT 01/30/21, 6:34 PM  [place] [facility] PHYSICAL AND SPORTS MEDICINE [address] [place], [address] Phone: [phone]   Fax:  [phone]  Name: Desiree Rivera MRN: [id number] Date of Birth: May 05, 1986

## 2021-02-03 ENCOUNTER — Ambulatory Visit: Payer: BC Managed Care – PPO | Admitting: Physical Therapy

## 2021-02-03 ENCOUNTER — Encounter: Payer: Self-pay | Admitting: Physical Therapy

## 2021-02-03 ENCOUNTER — Other Ambulatory Visit: Payer: Self-pay

## 2021-02-03 DIAGNOSIS — G8929 Other chronic pain: Secondary | ICD-10-CM | POA: Diagnosis not present

## 2021-02-03 DIAGNOSIS — M25551 Pain in right hip: Secondary | ICD-10-CM

## 2021-02-03 DIAGNOSIS — M62838 Other muscle spasm: Secondary | ICD-10-CM

## 2021-02-03 DIAGNOSIS — M5441 Lumbago with sciatica, right side: Secondary | ICD-10-CM | POA: Diagnosis not present

## 2021-02-03 DIAGNOSIS — M546 Pain in thoracic spine: Secondary | ICD-10-CM | POA: Diagnosis not present

## 2021-02-03 DIAGNOSIS — M25511 Pain in right shoulder: Secondary | ICD-10-CM | POA: Diagnosis not present

## 2021-02-03 NOTE — Therapy (Signed)
[facility]INE [address], [place], [address] Phone: [phone]   Fax:  [phone]  Physical Therapy Treatment  Patient Details  Name: Desiree Rivera MRN: [id number] Date of Birth: [birth year] Referring Provider (PT): Conard Novak, MD (OBGYN)   Encounter Date: 02/03/2021   PT End of Session - 02/03/21 1523    Visit Number 3    Number of Visits 24    Date for PT Re-Evaluation 04/22/21    Authorization Type [employer] reporting period from 01/28/2021    Progress Note Due on Visit 10    PT Start Time 1523    PT Stop Time 1601    PT Time Calculation (min) 38 min    Activity Tolerance Patient tolerated treatment well    Behavior During Therapy [facility] for tasks assessed/performed           Past Medical History:  Diagnosis Date  . Abnormal Pap smear of cervix   . CIN II (cervical intraepithelial neoplasia II)   . Migraine     Past Surgical History:  Procedure Laterality Date  . LEEP    . TONSILLECTOMY    . WISDOM TOOTH EXTRACTION      There were no vitals filed for this visit.   Subjective Assessment - 02/03/21 1524    Subjective Patient reports she continues to feel better. After she was at PT on Thursday she felt pretty good but since saturday everything from her right proximal glute max and into her hip has been really bad and down her leg. It is a bit better today after walking a lot rated 3-4/10 but this weekend was an 8/10 and she is up and down every  few minutes. She does not have pain in her thoracic spine currently but she does have occasional pain to touch in the the right ribs. She also notices some paresthesia in the right thoracic spine when she leans forward to do her eye makeup that feels fine when she straightens up again. R shoulder is doing pretty good. She does think sitting still on the weekends seems to have some to do with increased pain. She had a bit of pain in her R shoulder right  when she wakes up. Movement is better. She almost feels normal when she is working after a couple of hours. Denies any rash that accompanied her pain or pain distribution. When she was previously exercising, the only thing that really made the pain worse was running. she would get a few min in and then she would get hip pain during stance phase or after taking a shower and sitting.    Pertinent History Patient is a 35 y.o. female who presents to outpatient physical therapy with a referral for medical diagnosis chronic right-sided low back pain with right-sided sciatica. This patient's chief complaints consist of right sided thoracic spine, rib, shoulder, low back, hip, and leg pain proximal to knee that came on gradually ~ 7 months ago and is constant in nature but intermittent in intensity leading to the following functional deficits:  difficulty with or unable to perform usual activities including sleeping, working, bending, squatting, pushing pulling, sitting, getting in and out of car, running, 5x a week exercise routing including running, lifting, and stretching. She states it has also lead to weight gain which distresses her. Relevant past medical history and comorbidities include tension headaches, migraines, skin cancer removed when 35 years old.  Patient denies hx of stroke, seizures,  lung problem, major cardiac events, diabetes, unexplained weight loss, changes in bowel or bladder problems, new onset stumbling or dropping things, spinal surgeries.    Limitations Sitting;Lifting;Standing;Walking;House hold activities;Other (comment)   difficulty with or unable to perform usual activities including sleeping, working, bending, squatting, pushing pulling, sitting, getting in and out of car, running, 5x a week exercise routing including running, lifting, and stretching.   Diagnostic tests radiograph report for lumbar and thoracic spine and right hip 10/15/2020: "IMPRESSION:  Minimal midthoracic levoconvex  scoliosis. Otherwise negative exam."    Patient Stated Goals to get back to usual activities without pain    Currently in Pain? Yes    Pain Score 4     Pain Onset More than a month ago           TREATMENT:  Therapeutic exercise:to centralize symptoms and improve ROM, strength, muscular endurance, and activity tolerance required for successful completion of functional activities.  - seated thoracic extension over back of chair 1x10, (good carry over, discussed options for different spinal levels by putting pillow in chair, reviewed better/worse).  - seated thoracic AROM extension x 5 (good form).  - standing lumbar extension x 5 reporting it is uncomfortable - standing lumbar extension over plinth, 3x10 (improved ROM and decreasing end range pain at R waist line, plateau at set 3).  - lumbar extension in prone 1x10 - lumbar extension in prone with yoga blocks under hands for improved ROM, 2x10 with and without lat activation (pain in R shoulder without, does feel in abs and/or ribs).  - discussed symptoms and educated on options for starting to find strengthening and fitness activities that patient can tolerate and start integrating back into her life.  - posture correction in sitting with lumbar roll (report it feels very good). Discussed possible extension preference.  - Education on HEP including handout   Manual therapy: to reduce pain and tissue tension, improve range of motion, neuromodulation, in order to promote improved ability to complete functional activities. PRONE - STM to lumbar and thoracic paraspinals, B upper glutes, very tender near R SIJ.   - CPA to thoracic and lumbar spine grade II-IV, tender - discussed and educated patient on dry needling  Pt required multimodal cuing for proper technique and to facilitate improved neuromuscular control, strength, range of motion, and functional ability resulting in improved performance and form.  HOME EXERCISE PROGRAM  Access  Code: [facility] URL: [email or web address] Date: 02/03/2021 Prepared by: Norton Blizzard  Exercises Seated Thoracic Lumbar Extension - 2 x daily - 2 sets - 10 reps Seated Thoracic Extension with Hands Behind Neck - 2 x daily - 2 sets - 10 reps Standing Lumbar Extension with Counter - 2 x daily - 2 sets - 10-15 reps Seated Posture with Lumbar Roll     PT Education - 02/03/21 1705    Education Details exercise technique/form/purpose. Self management techniques.    Person(s) Educated Patient    Methods Explanation;Demonstration;Tactile cues;Verbal cues;Handout    Comprehension Verbalized understanding;Returned demonstration;Verbal cues required;Tactile cues required;Need further instruction            PT Short Term Goals - 02/03/21 1715      PT SHORT TERM GOAL #1   Title Be independent with initial home exercise program for self-management of symptoms.    Baseline to be established at visit 2 (01/28/2021);    Time 3    Period Weeks    Status Achieved    Target Date 02/18/21  PT Long Term Goals - 01/28/21 1917      PT LONG TERM GOAL #1   Title Be independent with a long-term home exercise program for self-management of symptoms.    Baseline Initial HEP to be established at visit 2 (01/28/2021);    Time 12    Period Weeks    Status New   TARGET DATE FOR ALL LONG TERM GOALS: 04/22/2021     PT LONG TERM GOAL #2   Title Demonstrate improved FOTO score by 10 units to demonstrate improvement in overall condition and self-reported functional ability.    Baseline to be measured visit 2 (01/28/2021);    Time 12    Period Weeks    Status New      PT LONG TERM GOAL #3   Title Have full thoracic and lumbar AROM with no compensations or increase in pain in all planes except intermittent end range discomfort to allow patient to complete valued activities with less difficulty.    Baseline painful and limited - see objective exam (01/28/2021);    Time 12    Period  Weeks    Status New      PT LONG TERM GOAL #4   Title Reduce pain with functional activities to equal or less than 2/10 to allow patient to complete usual activities including ADLs, IADLs, and social engagement with less difficulty.    Baseline 10/10 (01/28/2021);    Time 12    Period Weeks    Status New      PT LONG TERM GOAL #5   Title Complete community, work and/or recreational activities without limitation due to current condition    Baseline Functional Limitations: difficulty with or unable to perform usual activities including sleeping, working, bending, squatting, pushing pulling, sitting, getting in and out of car, running, 5x a week exercise routing including running, lifting, and stretching (01/28/2021);    Time 12    Period Weeks    Status New                 Plan - 02/03/21 1713    Clinical Impression Statement Patient tolerated treatment well and appears to be better overall. Reinforced idea of possible extension preference with more options for extension exercises. Patient actually seems better while on the move and worse at rest so educated on sitting techniques to relive pain and started discussing return to tolerated activities that patient is missing. Continues to be exquisitely sore with reproduction of all of patients right hip pain with pressure to right lumbar region near L5 and SIJ. Recommend screen of SIJ next session.  Patient would benefit from continued management of limiting condition by skilled physical therapist to address remaining impairments and functional limitations to work towards stated goals and return to PLOF or maximal functional independence.    Personal Factors and Comorbidities Age;Comorbidity 3+;Profession;Past/Current Experience;Fitness;Time since onset of injury/illness/exacerbation    Comorbidities Relevant past medical history and comorbidities include tension headaches, migraines, skin cancer removed when 35 years old.     Examination-Activity Limitations Bed Mobility;Lift;Stairs;Squat;Bend;Locomotion Level;Stand;Reach Overhead;Carry;Transfers;Sit;Sleep    Examination-Participation Restrictions Laundry;Cleaning;Community Activity;Occupation;Driving;Interpersonal Relationship   difficulty with or unable to perform usual activities including sleeping, working, bending, squatting, pushing pulling, sitting, getting in and out of car, running, 5x a week exercise routing including running, lifting, and stretching.   Stability/Clinical Decision Making Evolving/Moderate complexity    Rehab Potential Good    PT Frequency 2x / week    PT Duration 12 weeks    PT  Treatment/Interventions ADLs/Self Care Home Management;Aquatic Therapy;Cryotherapy;Moist Heat;Electrical Stimulation;Neuromuscular re-education;Therapeutic exercise;Therapeutic activities;Patient/family education;Manual techniques;Dry needling;Passive range of motion;Joint Manipulations;Spinal Manipulations;Taping    PT Next Visit Plan manual therapy and exercise as tolerated    PT Home Exercise Plan medbridge Access Code: [email or web address]    Consulted and Agree with Plan of Care Patient           Patient will benefit from skilled therapeutic intervention in order to improve the following deficits and impairments:  Increased fascial restricitons,Impaired sensation,Pain,Decreased mobility,Increased muscle spasms,Decreased activity tolerance,Decreased endurance,Decreased range of motion,Decreased strength,Hypomobility,Impaired perceived functional ability,Impaired UE functional use,Difficulty walking  Visit Diagnosis: Chronic right-sided low back pain with right-sided sciatica  Pain in thoracic spine  Pain in right hip  Chronic right shoulder pain  Other muscle spasm     Problem List Patient Active Problem List   Diagnosis Date Noted  . Personal history of other diseases of the female genital tract 11/25/2016  . History of other malignant neoplasm of skin  11/25/2016  . Allergic rhinitis 11/25/2016  . Cephalalgia 04/20/2006    Luretha Murphy. Ilsa Iha, PT, DPT 02/03/21, 5:15 PM  [facility] PH[facility] AND SPORTS MEDICINE [address], [place], [address] Phone: [phone]   Fax:  [phone]  Name: Desiree Rivera MRN: [id number] Date of Birth: 1986-07-03

## 2021-02-04 ENCOUNTER — Encounter: Payer: BC Managed Care – PPO | Admitting: Physical Therapy

## 2021-02-05 ENCOUNTER — Encounter: Payer: BC Managed Care – PPO | Admitting: Physical Therapy

## 2021-02-06 ENCOUNTER — Encounter: Payer: BC Managed Care – PPO | Admitting: Physical Therapy

## 2021-02-06 ENCOUNTER — Ambulatory Visit: Payer: BC Managed Care – PPO | Admitting: Physical Therapy

## 2021-02-06 ENCOUNTER — Encounter: Payer: Self-pay | Admitting: Physical Therapy

## 2021-02-06 ENCOUNTER — Other Ambulatory Visit: Payer: Self-pay

## 2021-02-06 DIAGNOSIS — G8929 Other chronic pain: Secondary | ICD-10-CM | POA: Diagnosis not present

## 2021-02-06 DIAGNOSIS — M546 Pain in thoracic spine: Secondary | ICD-10-CM | POA: Diagnosis not present

## 2021-02-06 DIAGNOSIS — M5414 Radiculopathy, thoracic region: Secondary | ICD-10-CM | POA: Diagnosis not present

## 2021-02-06 DIAGNOSIS — M62838 Other muscle spasm: Secondary | ICD-10-CM

## 2021-02-06 DIAGNOSIS — M5441 Lumbago with sciatica, right side: Secondary | ICD-10-CM | POA: Diagnosis not present

## 2021-02-06 DIAGNOSIS — M25551 Pain in right hip: Secondary | ICD-10-CM | POA: Diagnosis not present

## 2021-02-06 DIAGNOSIS — M9903 Segmental and somatic dysfunction of lumbar region: Secondary | ICD-10-CM | POA: Diagnosis not present

## 2021-02-06 DIAGNOSIS — M9902 Segmental and somatic dysfunction of thoracic region: Secondary | ICD-10-CM | POA: Diagnosis not present

## 2021-02-06 DIAGNOSIS — M25511 Pain in right shoulder: Secondary | ICD-10-CM | POA: Diagnosis not present

## 2021-02-06 NOTE — Therapy (Signed)
[facility] [address], [place], [address] Phone: [phone]   Fax:  [phone]  Physical Therapy Treatment  Patient Details  Name: Desiree Rivera MRN: [id number] Date of Birth: [birth year] Referring Provider (PT): Conard Novak, MD (OBGYN)   Encounter Date: 02/06/2021   PT End of Session - 02/06/21 1914    Visit Number 4    Number of Visits 24    Date for PT Re-Evaluation 04/22/21    Authorization Type [employer] reporting period from 01/28/2021    Progress Note Due on Visit 10    PT Start Time 1515    PT Stop Time 1555    PT Time Calculation (min) 40 min    Activity Tolerance Patient tolerated treatment well;Patient limited by pain    Behavior During Therapy [facility] for tasks assessed/performed           Past Medical History:  Diagnosis Date  . Abnormal Pap smear of cervix   . CIN II (cervical intraepithelial neoplasia II)   . Migraine     Past Surgical History:  Procedure Laterality Date  . LEEP    . TONSILLECTOMY    . WISDOM TOOTH EXTRACTION      There were no vitals filed for this visit.   Subjective Assessment - 02/06/21 1519    Subjective Patient reports current pain is 3-4/10 in the right lower back and right hip. Has been off work for the last few days. She tried walking on the threadmill and randomly had pain in the hip wiht heel strike. extension exercises helping the back but cause R shoulder pain after 1 set .    Pertinent History Patient is a 35 y.o. female who presents to outpatient physical therapy with a referral for medical diagnosis chronic right-sided low back pain with right-sided sciatica. This patient's chief complaints consist of right sided thoracic spine, rib, shoulder, low back, hip, and leg pain proximal to knee that came on gradually ~ 7 months ago and is constant in nature but intermittent in intensity leading to the following functional deficits:  difficulty with or  unable to perform usual activities including sleeping, working, bending, squatting, pushing pulling, sitting, getting in and out of car, running, 5x a week exercise routing including running, lifting, and stretching. She states it has also lead to weight gain which distresses her. Relevant past medical history and comorbidities include tension headaches, migraines, skin cancer removed when 35 years old.  Patient denies hx of stroke, seizures, lung problem, major cardiac events, diabetes, unexplained weight loss, changes in bowel or bladder problems, new onset stumbling or dropping things, spinal surgeries.    Limitations Sitting;Lifting;Standing;Walking;House hold activities;Other (comment)   difficulty with or unable to perform usual activities including sleeping, working, bending, squatting, pushing pulling, sitting, getting in and out of car, running, 5x a week exercise routing including running, lifting, and stretching.   Diagnostic tests radiograph report for lumbar and thoracic spine and right hip 10/15/2020: "IMPRESSION:  Minimal midthoracic levoconvex scoliosis. Otherwise negative exam."    Patient Stated Goals to get back to usual activities without pain    Pain Score 4     Pain Onset More than a month ago           TREATMENT:  Manual therapy: to reduce pain and tissue tension, improve range of motion, neuromodulation, in order to promote improved ability to complete functional activities. PRONE POSITION:  - STM to lumbar and thoracic paraspinals,  R upper glutes, very tender near R SIJ.   - SIJ tests (both negative): SIJ compression, sacral thrust - CPA to thoracic and lumbar spine grade II-IV,tender especially at base of spine (feels good tender).  - lumbar extension in (prone) lying with clinician overpressure. 1x10 (increases pain at right QL region with pressure near L5, worse; less pain with towel across back secured on both sides by PT, but with no effect).  HOOKLYING POSITION -  R hip LAD 1x30 seconds, 2x15 seconds. Initially felt really good but then started hurting in hip and back.  - R hip joint mobilizations with belt: caudal and posterolateral grade II-IV. Too painful at groin to continue.   Therapeutic exercise:to centralize symptoms and improve ROM, strength, muscular endurance, and activity tolerance required for successful completion of functional activities. - MDT repeated R hip extension in loaded position 1x10 (feels great during, no better after).  - MDT lateral glide to right (L side to wall) with yoga block at wall due to mechanics to get to end rage, 1x10 (during no effect at R and pain at left due to stretch, after stiffness in right hip).  - hooklying hip abduction with blue theraband around knees, 3 second holds, 1x~6 reps. Discontinued due to increasing R lateral hip pain.  - Hooklying bridge with/without isometric hip abduction against blue theraband. 1x6. Discontinued due to inceasing sharp pain at right hip/back with pressure through R heel.  - quadruped bird dog, legs only 2x5 each side with extra time to learn exercise (full bird dog too uncomfortable). Able to do 5 reps before pain started. Does demonstrate poor lumbopelvic control.  - Education on HEP including handout   Pt required multimodal cuing for proper technique and to facilitate improved neuromuscular control, strength, range of motion, and functional ability resulting in improved performance and form.  HOME EXERCISE PROGRAM Access Code: [email or web address] URL: [email or web address] Date: 02/06/2021 Prepared by: Norton BlizzardSara Mak Bonny  Exercises Seated Thoracic Lumbar Extension - 2 x daily - 2 sets - 10 reps Seated Thoracic Extension with Hands Behind Neck - 2 x daily - 2 sets - 10 reps Standing Lumbar Extension with Counter - 2 x daily - 2 sets - 10-15 reps Seated Posture with Lumbar Roll Bird Dog - 1 x daily - 2 sets - 5 reps - 1-5 seconds hold    PT Education - 02/06/21 1914     Education Details exercise technique/form/purpose. Self management techniques.    Person(s) Educated Patient    Methods Explanation;Demonstration;Tactile cues;Verbal cues;Handout    Comprehension Verbalized understanding;Returned demonstration;Verbal cues required;Tactile cues required;Need further instruction            PT Short Term Goals - 02/03/21 1715      PT SHORT TERM GOAL #1   Title Be independent with initial home exercise program for self-management of symptoms.    Baseline to be established at visit 2 (01/28/2021);    Time 3    Period Weeks    Status Achieved    Target Date 02/18/21             PT Long Term Goals - 01/28/21 1917      PT LONG TERM GOAL #1   Title Be independent with a long-term home exercise program for self-management of symptoms.    Baseline Initial HEP to be established at visit 2 (01/28/2021);    Time 12    Period Weeks    Status New   TARGET DATE FOR ALL LONG TERM GOALS: 04/22/2021  PT LONG TERM GOAL #2   Title Demonstrate improved FOTO score by 10 units to demonstrate improvement in overall condition and self-reported functional ability.    Baseline to be measured visit 2 (01/28/2021);    Time 12    Period Weeks    Status New      PT LONG TERM GOAL #3   Title Have full thoracic and lumbar AROM with no compensations or increase in pain in all planes except intermittent end range discomfort to allow patient to complete valued activities with less difficulty.    Baseline painful and limited - see objective exam (01/28/2021);    Time 12    Period Weeks    Status New      PT LONG TERM GOAL #4   Title Reduce pain with functional activities to equal or less than 2/10 to allow patient to complete usual activities including ADLs, IADLs, and social engagement with less difficulty.    Baseline 10/10 (01/28/2021);    Time 12    Period Weeks    Status New      PT LONG TERM GOAL #5   Title Complete community, work and/or recreational activities  without limitation due to current condition    Baseline Functional Limitations: difficulty with or unable to perform usual activities including sleeping, working, bending, squatting, pushing pulling, sitting, getting in and out of car, running, 5x a week exercise routing including running, lifting, and stretching (01/28/2021);    Time 12    Period Weeks    Status New                 Plan - 02/06/21 1927    Clinical Impression Statement Concentrated on further assessing for directional preference or response to specific exercise this session with minimal response. Patient may have extension preference but does not appear to have lasting improvement in lumbar symptoms. Thoracic symptoms appear to have improved and stayed better. Attempts at pain relieving techniques targeting R hip unsuccessful overall. Two SIJ tests negative decreasing liklihood of SIJ causing pain despite pain near that region which is also a referall pattern for lowest lumbar segments.  Patient's high level of flexibility, trouble stabilizing trunk during bird dog exercise, and age suggest she would benefit from core strength and motor control exercises. Plan to continue to pursue this route next session.  Also appears to get temporary relief from any nervous system input such as manual techniques or movement even if uncomfortable at times. Patient would benefit from continued management of limiting condition by skilled physical therapist to address remaining impairments and functional limitations to work towards stated goals and return to PLOF or maximal functional independence.    Personal Factors and Comorbidities Age;Comorbidity 3+;Profession;Past/Current Experience;Fitness;Time since onset of injury/illness/exacerbation    Comorbidities Relevant past medical history and comorbidities include tension headaches, migraines, skin cancer removed when 35 years old.    Examination-Activity Limitations Bed  Mobility;Lift;Stairs;Squat;Bend;Locomotion Level;Stand;Reach Overhead;Carry;Transfers;Sit;Sleep    Examination-Participation Restrictions Laundry;Cleaning;Community Activity;Occupation;Driving;Interpersonal Relationship   difficulty with or unable to perform usual activities including sleeping, working, bending, squatting, pushing pulling, sitting, getting in and out of car, running, 5x a week exercise routing including running, lifting, and stretching.   Stability/Clinical Decision Making Evolving/Moderate complexity    Rehab Potential Good    PT Frequency 2x / week    PT Duration 12 weeks    PT Treatment/Interventions ADLs/Self Care Home Management;Aquatic Therapy;Cryotherapy;Moist Heat;Electrical Stimulation;Neuromuscular re-education;Therapeutic exercise;Therapeutic activities;Patient/family education;Manual techniques;Dry needling;Passive range of motion;Joint Manipulations;Spinal Manipulations;Taping    PT  Next Visit Plan manual therapy and exercise as tolerated, motor control exercises for the trunk/hip    PT Home Exercise Plan medbridge Access Code: [facility]    Consulted and Agree with Plan of Care Patient           Patient will benefit from skilled therapeutic intervention in order to improve the following deficits and impairments:  Increased fascial restricitons,Impaired sensation,Pain,Decreased mobility,Increased muscle spasms,Decreased activity tolerance,Decreased endurance,Decreased range of motion,Decreased strength,Hypomobility,Impaired perceived functional ability,Impaired UE functional use,Difficulty walking  Visit Diagnosis: Chronic right-sided low back pain with right-sided sciatica  Pain in thoracic spine  Pain in right hip  Chronic right shoulder pain  Other muscle spasm     Problem List Patient Active Problem List   Diagnosis Date Noted  . Personal history of other diseases of the female genital tract 11/25/2016  . History of other malignant neoplasm of skin  11/25/2016  . Allergic rhinitis 11/25/2016  . Cephalalgia 04/20/2006    Luretha Murphy. Ilsa Iha, PT, DPT 02/06/21, 7:30 PM  [facility] PHYSICAL AND SPORTS MEDICINE [address], [place], [address] Phone: [phone]   Fax:  [phone]  Name: PAIZLEIGH WILDS MRN: [id number] Date of Birth: 1986/10/31

## 2021-02-10 ENCOUNTER — Ambulatory Visit: Payer: BC Managed Care – PPO | Admitting: Physical Therapy

## 2021-02-10 ENCOUNTER — Other Ambulatory Visit: Payer: Self-pay

## 2021-02-10 DIAGNOSIS — G8929 Other chronic pain: Secondary | ICD-10-CM | POA: Diagnosis not present

## 2021-02-10 DIAGNOSIS — M25511 Pain in right shoulder: Secondary | ICD-10-CM

## 2021-02-10 DIAGNOSIS — M62838 Other muscle spasm: Secondary | ICD-10-CM | POA: Diagnosis not present

## 2021-02-10 DIAGNOSIS — M5441 Lumbago with sciatica, right side: Secondary | ICD-10-CM | POA: Diagnosis not present

## 2021-02-10 DIAGNOSIS — M546 Pain in thoracic spine: Secondary | ICD-10-CM | POA: Diagnosis not present

## 2021-02-10 DIAGNOSIS — M25551 Pain in right hip: Secondary | ICD-10-CM

## 2021-02-10 NOTE — Therapy (Signed)
[place] [facility] [address] [place], [address] Phone: [phone]   Fax:  [phone]  Physical Therapy Treatment  Patient Details  Name: Desiree Rivera MRN: [id number] Date of Birth: 09/04/1986 Referring Provider (PT): Conard Novak, MD (OBGYN)   Encounter Date: 02/10/2021   PT End of Session - 02/11/21 1803    Visit Number 5    Number of Visits 24    Date for PT Re-Evaluation 04/22/21    Authorization Type [employer] reporting period from 01/28/2021    Progress Note Due on Visit 10    PT Start Time 1605    PT Stop Time 1643    PT Time Calculation (min) 38 min    Activity Tolerance Patient tolerated treatment well    Behavior During Therapy [facility] for tasks assessed/performed           Past Medical History:  Diagnosis Date  . Abnormal Pap smear of cervix   . CIN II (cervical intraepithelial neoplasia II)   . Migraine     Past Surgical History:  Procedure Laterality Date  . LEEP    . TONSILLECTOMY    . WISDOM TOOTH EXTRACTION      There were no vitals filed for this visit.   Subjective Assessment - 02/10/21 1606    Subjective Patient reports she is feeling better overall and has been able to do her bird dogs very consistently. She feels 90 percent better. She continues to have pain in both hips at the front and back when she walks R > L and when she crosse her R leg over the left. She also is having pain at her right shoulder when she does a lot. The pain has not been over 4-5/10 during the weekend (this happens after she has sat for a long period of time). She JUST got word she got a promotion that she is extremely excited about, so she feels a little unfocused. She feels like every week she is feeing better.    Pertinent History Patient is a 35 y.o. female who presents to outpatient physical therapy with a referral for medical diagnosis chronic right-sided low back pain with right-sided sciatica.  This patient's chief complaints consist of right sided thoracic spine, rib, shoulder, low back, hip, and leg pain proximal to knee that came on gradually ~ 7 months ago and is constant in nature but intermittent in intensity leading to the following functional deficits:  difficulty with or unable to perform usual activities including sleeping, working, bending, squatting, pushing pulling, sitting, getting in and out of car, running, 5x a week exercise routing including running, lifting, and stretching. She states it has also lead to weight gain which distresses her. Relevant past medical history and comorbidities include tension headaches, migraines, skin cancer removed when 35 years old.  Patient denies hx of stroke, seizures, lung problem, major cardiac events, diabetes, unexplained weight loss, changes in bowel or bladder problems, new onset stumbling or dropping things, spinal surgeries.    Limitations Sitting;Lifting;Standing;Walking;House hold activities;Other (comment)   difficulty with or unable to perform usual activities including sleeping, working, bending, squatting, pushing pulling, sitting, getting in and out of car, running, 5x a week exercise routing including running, lifting, and stretching.   Diagnostic tests radiograph report for lumbar and thoracic spine and right hip 10/15/2020: "IMPRESSION:  Minimal midthoracic levoconvex scoliosis. Otherwise negative exam."    Patient Stated Goals to get back to usual activities without pain  Currently in Pain? Yes    Pain Score 2     Pain Onset More than a month ago           TREATMENT:  Therapeutic exercise:to centralize symptoms and improve ROM, strength, muscular endurance, and activity tolerance required for successful completion of functional activities. - Quadruped bird dog (alternating shoulder flexion/contralateral hip extension with core muscles braced) , with cone over sacrum for biofeedback to help keep core stabilized. 3x10  each side. . - hooklying abdominal brace with alternating marching, 1x10 each side - hooklying abdominal brace with alternating L extension 1x10 each side.  - hooklying abdominal brace, bringing one leg at a time up to and down from reverse curl position, 1x~ 3 and discontinued due to increasing pain at R anterior hip.  - hooklying reverse curl holds, 5x10 seconds - prone "frogger" with green theraband around knees and half foam roll under ASIS, 2x10 - Education on HEP including handout   Pt required multimodal cuing for proper technique and to facilitate improved neuromuscular control, strength, range of motion, and functional ability resulting in improved performance and form.   HOME EXERCISE PROGRAM Access Code: [facility] URL: [email or web address] Date: 02/06/2021 Prepared by: Norton Blizzard  Exercises Seated Thoracic Lumbar Extension - 2 x daily - 2 sets - 10 reps Seated Thoracic Extension with Hands Behind Neck - 2 x daily - 2 sets - 10 reps Standing Lumbar Extension with Counter - 2 x daily - 2 sets - 10-15 reps Seated Posture with Lumbar Roll Bird Dog - 1 x daily - 2 sets - 5 reps - 1-5 seconds hold  [email or web address]  [id number] Home Exercise Program [[id number]]  Prone bilateral hip extension  -  Repeat 10 Times, Hold 5 Seconds, Complete 2 Sets, Perform 1 Times a Day    PT Education - 02/11/21 1803    Education Details exercise technique/form/purpose. Self management techniques.    Person(s) Educated Patient    Methods Explanation;Demonstration;Tactile cues;Verbal cues    Comprehension Verbalized understanding;Returned demonstration;Verbal cues required;Tactile cues required;Need further instruction            PT Short Term Goals - 02/03/21 1715      PT SHORT TERM GOAL #1   Title Be independent with initial home exercise program for self-management of symptoms.    Baseline to be established at visit 2 (01/28/2021);    Time 3    Period Weeks    Status  Achieved    Target Date 02/18/21             PT Long Term Goals - 01/28/21 1917      PT LONG TERM GOAL #1   Title Be independent with a long-term home exercise program for self-management of symptoms.    Baseline Initial HEP to be established at visit 2 (01/28/2021);    Time 12    Period Weeks    Status New   TARGET DATE FOR ALL LONG TERM GOALS: 04/22/2021     PT LONG TERM GOAL #2   Title Demonstrate improved FOTO score by 10 units to demonstrate improvement in overall condition and self-reported functional ability.    Baseline to be measured visit 2 (01/28/2021);    Time 12    Period Weeks    Status New      PT LONG TERM GOAL #3   Title Have full thoracic and lumbar AROM with no compensations or increase in pain in all planes except intermittent end range discomfort to allow patient to complete  valued activities with less difficulty.    Baseline painful and limited - see objective exam (01/28/2021);    Time 12    Period Weeks    Status New      PT LONG TERM GOAL #4   Title Reduce pain with functional activities to equal or less than 2/10 to allow patient to complete usual activities including ADLs, IADLs, and social engagement with less difficulty.    Baseline 10/10 (01/28/2021);    Time 12    Period Weeks    Status New      PT LONG TERM GOAL #5   Title Complete community, work and/or recreational activities without limitation due to current condition    Baseline Functional Limitations: difficulty with or unable to perform usual activities including sleeping, working, bending, squatting, pushing pulling, sitting, getting in and out of car, running, 5x a week exercise routing including running, lifting, and stretching (01/28/2021);    Time 12    Period Weeks    Status New                 Plan - 02/11/21 1808    Clinical Impression Statement Patient tolerated treatment well overall with minimal exacerbation of painful symptoms. Did report mild increased R hip pain after  exercises. Found core exercises challenging, especially "frogger" exercise and demonstrated improved control and tolerance with bird dog. Overall continues to improve. Patient would benefit from continued management of limiting condition by skilled physical therapist to address remaining impairments and functional limitations to work towards stated goals and return to PLOF or maximal functional independence    Personal Factors and Comorbidities Age;Comorbidity 3+;Profession;Past/Current Experience;Fitness;Time since onset of injury/illness/exacerbation    Comorbidities Relevant past medical history and comorbidities include tension headaches, migraines, skin cancer removed when 35 years old.    Examination-Activity Limitations Bed Mobility;Lift;Stairs;Squat;Bend;Locomotion Level;Stand;Reach Overhead;Carry;Transfers;Sit;Sleep    Examination-Participation Restrictions Laundry;Cleaning;Community Activity;Occupation;Driving;Interpersonal Relationship   difficulty with or unable to perform usual activities including sleeping, working, bending, squatting, pushing pulling, sitting, getting in and out of car, running, 5x a week exercise routing including running, lifting, and stretching.   Stability/Clinical Decision Making Evolving/Moderate complexity    Rehab Potential Good    PT Frequency 2x / week    PT Duration 12 weeks    PT Treatment/Interventions ADLs/Self Care Home Management;Aquatic Therapy;Cryotherapy;Moist Heat;Electrical Stimulation;Neuromuscular re-education;Therapeutic exercise;Therapeutic activities;Patient/family education;Manual techniques;Dry needling;Passive range of motion;Joint Manipulations;Spinal Manipulations;Taping    PT Next Visit Plan manual therapy and exercise as tolerated, motor control exercises for the trunk/hip    PT Home Exercise Plan medbridge Access Code: [email or web address] ; [email or web address]   [id number] Home Exercise Program ([id number])    Consulted and Agree with Plan of Care Patient            Patient will benefit from skilled therapeutic intervention in order to improve the following deficits and impairments:  Increased fascial restricitons,Impaired sensation,Pain,Decreased mobility,Increased muscle spasms,Decreased activity tolerance,Decreased endurance,Decreased range of motion,Decreased strength,Hypomobility,Impaired perceived functional ability,Impaired UE functional use,Difficulty walking  Visit Diagnosis: Chronic right-sided low back pain with right-sided sciatica  Pain in thoracic spine  Pain in right hip  Chronic right shoulder pain  Other muscle spasm     Problem List Patient Active Problem List   Diagnosis Date Noted  . Personal history of other diseases of the female genital tract 11/25/2016  . History of other malignant neoplasm of skin 11/25/2016  . Allergic rhinitis 11/25/2016  . Cephalalgia 04/20/2006    Luretha Murphy. Ilsa Iha, PT, DPT 02/11/21, 6:09 PM  [place]  [facility] [address] [place], [place], [address] Phone: [phone]   Fax:  [phone]  Name: Sinclair GroomsSamantha L Piontek MRN: [id number] Date of Birth: 1986-06-19

## 2021-02-11 ENCOUNTER — Encounter: Payer: Self-pay | Admitting: Physical Therapy

## 2021-02-11 ENCOUNTER — Encounter: Payer: BC Managed Care – PPO | Admitting: Physical Therapy

## 2021-02-12 ENCOUNTER — Ambulatory Visit: Payer: BC Managed Care – PPO | Admitting: Physical Therapy

## 2021-02-13 ENCOUNTER — Encounter: Payer: BC Managed Care – PPO | Admitting: Physical Therapy

## 2021-02-13 DIAGNOSIS — M9902 Segmental and somatic dysfunction of thoracic region: Secondary | ICD-10-CM | POA: Diagnosis not present

## 2021-02-13 DIAGNOSIS — M9903 Segmental and somatic dysfunction of lumbar region: Secondary | ICD-10-CM | POA: Diagnosis not present

## 2021-02-13 DIAGNOSIS — M5441 Lumbago with sciatica, right side: Secondary | ICD-10-CM | POA: Diagnosis not present

## 2021-02-13 DIAGNOSIS — M5414 Radiculopathy, thoracic region: Secondary | ICD-10-CM | POA: Diagnosis not present

## 2021-02-17 ENCOUNTER — Encounter: Payer: Self-pay | Admitting: Physical Therapy

## 2021-02-17 ENCOUNTER — Ambulatory Visit: Payer: BC Managed Care – PPO | Admitting: Physical Therapy

## 2021-02-17 ENCOUNTER — Other Ambulatory Visit: Payer: Self-pay

## 2021-02-17 DIAGNOSIS — M62838 Other muscle spasm: Secondary | ICD-10-CM | POA: Diagnosis not present

## 2021-02-17 DIAGNOSIS — M25511 Pain in right shoulder: Secondary | ICD-10-CM | POA: Diagnosis not present

## 2021-02-17 DIAGNOSIS — M25551 Pain in right hip: Secondary | ICD-10-CM | POA: Diagnosis not present

## 2021-02-17 DIAGNOSIS — M546 Pain in thoracic spine: Secondary | ICD-10-CM

## 2021-02-17 DIAGNOSIS — G8929 Other chronic pain: Secondary | ICD-10-CM | POA: Diagnosis not present

## 2021-02-17 DIAGNOSIS — M5441 Lumbago with sciatica, right side: Secondary | ICD-10-CM | POA: Diagnosis not present

## 2021-02-17 NOTE — Therapy (Signed)
[place] [facility] [address] [place], [place], [address] Phone: [phone]   Fax:  [phone]  Physical Therapy Treatment  Patient Details  Name: Desiree GroomsSamantha L Rivera MRN: [id number] Date of Birth: 12/23/1985 Referring Provider (PT): Conard NovakJackson, Stephen D, MD (OBGYN)   Encounter Date: 02/17/2021   PT End of Session - 02/17/21 1828    Visit Number 6    Number of Visits 24    Date for PT Re-Evaluation 04/22/21    Authorization Type [employer] reporting period from 01/28/2021    Progress Note Due on Visit 10    PT Start Time 1603    PT Stop Time 1641    PT Time Calculation (min) 38 min    Activity Tolerance Patient tolerated treatment well    Behavior [facility] for tasks assessed/performed           Past Medical History:  Diagnosis Date  . Abnormal Pap smear of cervix   . CIN II (cervical intraepithelial neoplasia II)   . Migraine     Past Surgical History:  Procedure Laterality Date  . LEEP    . TONSILLECTOMY    . WISDOM TOOTH EXTRACTION      There were no vitals filed for this visit.   Subjective Assessment - 02/17/21 1604    Subjective Patient reports she is feeling really well today. She got a TENS unit and that is really helping. She feels like she needs to get a new couch. States she has a bit of achy pain in the right shoulder and near R scapula that she attributes to where she had the TENS pads and turned it up. Rates pain there 4-5/10. States she feels good enought today to try going back to a workout. She missed last PT session due to being really busy with her new job. She has been doing exercises from chiro and PT and she always feels good after exercises. Does say her R hip contiues to hurt at the front and down the lateral side of her leg different than the left.    Pertinent History Patient is a 35 y.o. female who presents to outpatient physical therapy with a referral for medical diagnosis chronic  right-sided low back pain with right-sided sciatica. This patient's chief complaints consist of right sided thoracic spine, rib, shoulder, low back, hip, and leg pain proximal to knee that came on gradually ~ 7 months ago and is constant in nature but intermittent in intensity leading to the following functional deficits:  difficulty with or unable to perform usual activities including sleeping, working, bending, squatting, pushing pulling, sitting, getting in and out of car, running, 5x a week exercise routing including running, lifting, and stretching. She states it has also lead to weight gain which distresses her. Relevant past medical history and comorbidities include tension headaches, migraines, skin cancer removed when 35 years old.  Patient denies hx of stroke, seizures, lung problem, major cardiac events, diabetes, unexplained weight loss, changes in bowel or bladder problems, new onset stumbling or dropping things, spinal surgeries.    Limitations Sitting;Lifting;Standing;Walking;House hold activities;Other (comment)   difficulty with or unable to perform usual activities including sleeping, working, bending, squatting, pushing pulling, sitting, getting in and out of car, running, 5x a week exercise routing including running, lifting, and stretching.   Diagnostic tests radiograph report for lumbar and thoracic spine and right hip 10/15/2020: "IMPRESSION:  Minimal midthoracic levoconvex scoliosis. Otherwise negative exam."    Patient  Stated Goals to get back to usual activities without pain    Currently in Pain? Yes    Pain Score 5     Pain Onset More than a month ago             TREATMENT:  Therapeutic exercise:to centralize symptoms and improve ROM, strength, muscular endurance, and activity tolerance required for successful completion of functional activities. - seated thoracic extension over the back of the chair, 1x10 - education on disc model, flexion model of back pain care.  Education on pain science as it applies to pt's case. Education on inversion table, traction.  - prone "frogger" with no TB and green theraband around knees and half foam roll under ASIS, 2x5, 1x10(Very difficult) - hooklying bridge with B hip abduction against GTB around knees, 1x20 (preferred to frogger).  - Education on HEP   Pt required multimodal cuing for proper technique and to facilitate improved neuromuscular control, strength, range of motion, and functional ability resulting in improved performance and form.   HOME EXERCISE PROGRAM Access Code: [facility] URL: [email or web address] Date: 02/17/2021 Prepared by: Norton Blizzard  Exercises Seated Thoracic Lumbar Extension - 2 x daily - 2 sets - 10 reps Seated Thoracic Extension with Hands Behind Neck - 2 x daily - 2 sets - 10 reps Standing Lumbar Extension with Counter - 2 x daily - 2 sets - 10-15 reps Seated Posture with Lumbar Roll Bird Dog - 1 x daily - 2 sets - 5 reps - 1-5 seconds hold Bridge with Hip Abduction and Resistance - 1 x daily - 3 sets - 20 reps   [email or web address]  [id number] Home Exercise Program [[id number]]  Prone bilateral hip extension  -  Repeat 10 Times, Hold 5 Seconds, Complete 2 Sets, Perform 1 Times a Day    PT Education - 02/17/21 1827    Education Details exercise technique/form/purpose. Self management techniques. anatomy/physiology of current condition and pain science education    Person(s) Educated Patient    Methods Explanation;Demonstration;Tactile cues;Verbal cues    Comprehension Verbalized understanding;Returned demonstration;Verbal cues required;Tactile cues required;Need further instruction            PT Short Term Goals - 02/03/21 1715      PT SHORT TERM GOAL #1   Title Be independent with initial home exercise program for self-management of symptoms.    Baseline to be established at visit 2 (01/28/2021);    Time 3    Period Weeks    Status Achieved    Target Date  02/18/21             PT Long Term Goals - 01/28/21 1917      PT LONG TERM GOAL #1   Title Be independent with a long-term home exercise program for self-management of symptoms.    Baseline Initial HEP to be established at visit 2 (01/28/2021);    Time 12    Period Weeks    Status New   TARGET DATE FOR ALL LONG TERM GOALS: 04/22/2021     PT LONG TERM GOAL #2   Title Demonstrate improved FOTO score by 10 units to demonstrate improvement in overall condition and self-reported functional ability.    Baseline to be measured visit 2 (01/28/2021);    Time 12    Period Weeks    Status New      PT LONG TERM GOAL #3   Title Have full thoracic and lumbar AROM with no compensations or increase in pain in all planes except intermittent  end range discomfort to allow patient to complete valued activities with less difficulty.    Baseline painful and limited - see objective exam (01/28/2021);    Time 12    Period Weeks    Status New      PT LONG TERM GOAL #4   Title Reduce pain with functional activities to equal or less than 2/10 to allow patient to complete usual activities including ADLs, IADLs, and social engagement with less difficulty.    Baseline 10/10 (01/28/2021);    Time 12    Period Weeks    Status New      PT LONG TERM GOAL #5   Title Complete community, work and/or recreational activities without limitation due to current condition    Baseline Functional Limitations: difficulty with or unable to perform usual activities including sleeping, working, bending, squatting, pushing pulling, sitting, getting in and out of car, running, 5x a week exercise routing including running, lifting, and stretching (01/28/2021);    Time 12    Period Weeks    Status New                 Plan - 02/17/21 1833    Clinical Impression Statement Patient feeling better overall since last treatment session and is doing her exercises at home with continued improvement. Does still have R hip pain that  concerns patient but demonstrates improved tolerance for bridges (which she could not do previously). Continues to report and demonstrate improved pain with movement. Spent time discussing various exercises/interventions/models and answering questions within scope of practice. Patient would benefit from continued management of limiting condition by skilled physical therapist to address remaining impairments and functional limitations to work towards stated goals and return to PLOF or maximal functional independence.    Personal Factors and Comorbidities Age;Comorbidity 3+;Profession;Past/Current Experience;Fitness;Time since onset of injury/illness/exacerbation    Comorbidities Relevant past medical history and comorbidities include tension headaches, migraines, skin cancer removed when 35 years old.    Examination-Activity Limitations Bed Mobility;Lift;Stairs;Squat;Bend;Locomotion Level;Stand;Reach Overhead;Carry;Transfers;Sit;Sleep    Examination-Participation Restrictions Laundry;Cleaning;Community Activity;Occupation;Driving;Interpersonal Relationship   difficulty with or unable to perform usual activities including sleeping, working, bending, squatting, pushing pulling, sitting, getting in and out of car, running, 5x a week exercise routing including running, lifting, and stretching.   Stability/Clinical Decision Making Evolving/Moderate complexity    Rehab Potential Good    PT Frequency 2x / week    PT Duration 12 weeks    PT Treatment/Interventions ADLs/Self Care Home Management;Aquatic Therapy;Cryotherapy;Moist Heat;Electrical Stimulation;Neuromuscular re-education;Therapeutic exercise;Therapeutic activities;Patient/family education;Manual techniques;Dry needling;Passive range of motion;Joint Manipulations;Spinal Manipulations;Taping    PT Next Visit Plan manual therapy and exercise as tolerated, motor control exercises for the trunk/hip    PT Home Exercise Plan medbridge Access Code: [email or web address] ;  [email or web address]   [id number] Home Exercise Program ([id number])    Consulted and Agree with Plan of Care Patient           Patient will benefit from skilled therapeutic intervention in order to improve the following deficits and impairments:  Increased fascial restricitons,Impaired sensation,Pain,Decreased mobility,Increased muscle spasms,Decreased activity tolerance,Decreased endurance,Decreased range of motion,Decreased strength,Hypomobility,Impaired perceived functional ability,Impaired UE functional use,Difficulty walking  Visit Diagnosis: Chronic right-sided low back pain with right-sided sciatica  Pain in thoracic spine  Pain in right hip  Chronic right shoulder pain  Other muscle spasm     Problem List Patient Active Problem List   Diagnosis Date Noted  . Personal history of other diseases of the female genital tract 11/25/2016  . History of other  malignant neoplasm of skin 11/25/2016  . Allergic rhinitis 11/25/2016  . Cephalalgia 04/20/2006    Luretha Murphy. Ilsa Iha, PT, DPT 02/17/21, 6:34 PM  [place] [facility] PHYSICAL AND SPORTS MEDICINE [address], [place], [address] Phone: [phone]   Fax:  [phone]  Name: Desiree Rivera MRN: [id number] Date of Birth: [birth year]

## 2021-02-18 ENCOUNTER — Encounter: Payer: BC Managed Care – PPO | Admitting: Physical Therapy

## 2021-02-19 ENCOUNTER — Ambulatory Visit: Payer: BC Managed Care – PPO | Admitting: Physical Therapy

## 2021-02-20 ENCOUNTER — Encounter: Payer: BC Managed Care – PPO | Admitting: Physical Therapy

## 2021-02-20 DIAGNOSIS — M9903 Segmental and somatic dysfunction of lumbar region: Secondary | ICD-10-CM | POA: Diagnosis not present

## 2021-02-20 DIAGNOSIS — M9902 Segmental and somatic dysfunction of thoracic region: Secondary | ICD-10-CM | POA: Diagnosis not present

## 2021-02-20 DIAGNOSIS — M5441 Lumbago with sciatica, right side: Secondary | ICD-10-CM | POA: Diagnosis not present

## 2021-02-20 DIAGNOSIS — M5414 Radiculopathy, thoracic region: Secondary | ICD-10-CM | POA: Diagnosis not present

## 2021-02-24 ENCOUNTER — Ambulatory Visit: Payer: BC Managed Care – PPO | Admitting: Physical Therapy

## 2021-02-27 DIAGNOSIS — M9902 Segmental and somatic dysfunction of thoracic region: Secondary | ICD-10-CM | POA: Diagnosis not present

## 2021-02-27 DIAGNOSIS — M5414 Radiculopathy, thoracic region: Secondary | ICD-10-CM | POA: Diagnosis not present

## 2021-02-27 DIAGNOSIS — M9903 Segmental and somatic dysfunction of lumbar region: Secondary | ICD-10-CM | POA: Diagnosis not present

## 2021-02-27 DIAGNOSIS — M5441 Lumbago with sciatica, right side: Secondary | ICD-10-CM | POA: Diagnosis not present

## 2021-03-03 ENCOUNTER — Ambulatory Visit: Payer: BC Managed Care – PPO | Admitting: Physical Therapy

## 2021-03-09 ENCOUNTER — Other Ambulatory Visit: Payer: Self-pay | Admitting: Obstetrics and Gynecology

## 2021-03-09 DIAGNOSIS — Z01419 Encounter for gynecological examination (general) (routine) without abnormal findings: Secondary | ICD-10-CM

## 2021-03-09 DIAGNOSIS — Z3041 Encounter for surveillance of contraceptive pills: Secondary | ICD-10-CM

## 2021-03-10 ENCOUNTER — Encounter: Payer: BC Managed Care – PPO | Admitting: Physical Therapy

## 2021-03-17 ENCOUNTER — Encounter: Payer: BC Managed Care – PPO | Admitting: Physical Therapy

## 2021-03-21 ENCOUNTER — Other Ambulatory Visit: Payer: Self-pay

## 2021-03-21 ENCOUNTER — Ambulatory Visit: Payer: BC Managed Care – PPO | Admitting: Family Medicine

## 2021-03-21 ENCOUNTER — Encounter: Payer: Self-pay | Admitting: Family Medicine

## 2021-03-21 VITALS — BP 134/87 | HR 67 | Resp 16 | Wt 191.0 lb

## 2021-03-21 DIAGNOSIS — G8929 Other chronic pain: Secondary | ICD-10-CM

## 2021-03-21 DIAGNOSIS — M25551 Pain in right hip: Secondary | ICD-10-CM | POA: Diagnosis not present

## 2021-03-21 MED ORDER — METHOCARBAMOL 500 MG PO TABS: 500.0000 mg | ORAL_TABLET | Freq: Four times a day (QID) | ORAL | 0 refills | Status: DC

## 2021-03-21 NOTE — Progress Notes (Signed)
I,April Miller,acting as a Neurosurgeon for [employer], PA-C.,have documented all relevant documentation on the behalf of Desiree Kern, PA-C,as directed by  [employer], PA-C while in the presence of [employer], PA-C.  Established patient visit   Patient: Desiree Rivera   DOB: 09-05-1986   35 y.o. Female  MRN: [id number] Visit Date: 03/21/2021  Today's healthcare provider: [name], PA-C   Chief Complaint  Patient presents with   Back Pain   Subjective    HPI  Lumbar back pain and Hip pain From 10/07/2020-given prednisone and x-ray obtained.   Patient is still having pain in her right hip and lower back. Patient states she has done PT with no relief.     Past Medical History:  Diagnosis Date   Abnormal Pap smear of cervix    CIN II (cervical intraepithelial neoplasia II)    Migraine    Past Surgical History:  Procedure Laterality Date   LEEP     TONSILLECTOMY     WISDOM TOOTH EXTRACTION     Social History   Tobacco Use   Smoking status: Former Smoker    Quit date: 11/24/2007    Years since quitting: 13.3   Smokeless tobacco: Never Used  Vaping Use   Vaping Use: Never used  Substance Use Topics   Drug use: No   Family History  Problem Relation Age of Onset   Diabetes Mellitus II Maternal Grandmother    Thyroid cancer Maternal Aunt    Lung cancer Maternal Grandfather    Allergies  Allergen Reactions   Sulfa Antibiotics Rash       Medications: Outpatient Medications Prior to Visit  Medication Sig   MULTIPLE VITAMIN PO Take by mouth.   Norgestimate-Ethinyl Estradiol Triphasic (TRI-SPRINTEC) 0.18/0.215/0.25 MG-35 MCG tablet Take 1 tablet by mouth daily.   No facility-administered medications prior to visit.    Review of Systems  Constitutional: Negative.   HENT: Negative.   Respiratory: Negative.   Cardiovascular: Negative.   Musculoskeletal: Positive for back pain.       Left hip pain.        Objective    BP 134/87  (BP Location: Right Arm, Patient Position: Sitting, Cuff Size: Large)   Pulse 67   Resp 16   Wt 191 lb (86.6 kg)   SpO2 99%   BMI 31.78 kg/m      Physical Exam Constitutional:      General: She is not in acute distress.    Appearance: She is well-developed.  HENT:     Head: Normocephalic and atraumatic.     Right Ear: Hearing normal.     Left Ear: Hearing normal.     Nose: Nose normal.  Eyes:     General: Lids are normal. No scleral icterus.       Right eye: No discharge.        Left eye: No discharge.     Conjunctiva/sclera: Conjunctivae normal.  Pulmonary:     Effort: Pulmonary effort is normal. No respiratory distress.  Abdominal:     General: Bowel sounds are normal.     Palpations: Abdomen is soft.  Musculoskeletal:        General: Tenderness present. Normal range of motion.     Comments: Pain in the right hip and pelvis area to palpate and check ROM. No numbness or weakness.  Skin:    Findings: No lesion or rash.  Neurological:     Mental Status: She is  alert and oriented to person, place, and time.  Psychiatric:        Speech: Speech normal.        Behavior: Behavior normal.        Thought Content: Thought content normal.     No results found for any visits on 03/21/21.  Assessment & Plan     1. Chronic right hip pain Right hip and pelvis pain over the past 6 months. Has had prednisone, NSAID's, heat & Ice applications and chiropractic evaluations with physical therapy. No significant relief from any of these therapies. Recommend muscle relaxer and orthopedic referral. - methocarbamol (ROBAXIN) 500 MG tablet; Take 1 tablet (500 mg total) by mouth 4 (four) times daily.  Dispense: 30 tablet; Refill: 0   No follow-ups on file.      I, Ramani Riva, PA-C, have reviewed all documentation for this visit. The documentation on 03/21/21 for the exam, diagnosis, procedures, and orders are all accurate and complete.  Am the supervising physician for Desiree Contes, PA and I am signing off on this unfinished note with no further clinical information Desiree Rivera., MD   Desiree Kern, PA-C  [facility] [phone] (phone) [phone] (fax)  [facility]

## 2021-03-25 ENCOUNTER — Ambulatory Visit: Payer: Self-pay

## 2021-03-25 ENCOUNTER — Other Ambulatory Visit: Payer: Self-pay

## 2021-03-25 ENCOUNTER — Ambulatory Visit: Payer: BC Managed Care – PPO | Admitting: Family Medicine

## 2021-03-25 DIAGNOSIS — M25551 Pain in right hip: Secondary | ICD-10-CM | POA: Diagnosis not present

## 2021-03-25 DIAGNOSIS — M25559 Pain in unspecified hip: Secondary | ICD-10-CM

## 2021-03-25 MED ORDER — BACLOFEN 10 MG PO TABS
5.0000 mg | ORAL_TABLET | Freq: Every evening | ORAL | 3 refills | Status: DC | PRN
Start: 2021-03-25 — End: 2024-10-13

## 2021-03-25 MED ORDER — MELOXICAM 15 MG PO TABS
7.5000 mg | ORAL_TABLET | Freq: Every day | ORAL | 6 refills | Status: DC | PRN
Start: 2021-03-25 — End: 2021-04-25

## 2021-03-25 NOTE — Progress Notes (Signed)
Office Visit Note   Patient: Desiree Rivera           Date of Birth: 1986-03-02           MRN: [id number] Visit Date: 03/25/2021 Requested by: Tamsen Roers, PA-C [address] [place],  [place] [address] PCP: Malva Limes, MD  Subjective: Chief Complaint  Patient presents with  . Right Hip - Pain    Pain deep in the hip joint x 7 months. Unsure of any specific injury, but was working out a lot and has a physical job. Constant pain from a 2-10, depending on activity. Has been going to PT for the lower back. Prednisone was no help. Muscle relaxer takes the edge off. Ice/heat do not help. PT and chiropractic treatments help at the time, but the pain returns.   . Lower Back - Pain    HPI: 35yo F presenting to clinic with concerns of 7 months of right hip/SI pain. Patient states that she is in 'constant' pain, though cannot recall a specific injury. Prior to the onset of her symptoms, she was avid in the gym- running at least an hour daily followed by weights. She also worked a very physical job. She started to get back in the right side of her lower back, radiating to her right groin. Endorses a 'clicking' and a 'catching' sensation in her right groin, and tenderness throughout the muscles in this region. She has tried massage, PT, chiropractor, and medications- none of which have offered significant relief. She says she has been unable to tolerate any of her usual exercises since this pain started, which is very distressing to her.               ROS:   All other systems were reviewed and are negative.  Objective: Vital Signs: There were no vitals taken for this visit.  Physical Exam:  General:  Alert and oriented, in no acute distress. Pulm:  Breathing unlabored. Psy:  Normal mood, congruent affect. Skin:  Right inguinal area and lower back with no bruising, no rashes, no erythema. Overlying skin intact.   Right hip:  Normal gait.  No varus or valgus deformity of the  knee, appropriate Q angle of the hip.  No pelvic asymmetry.   Full range of motion of hip, though endorses pain with internal rotation.   Strength: 5 out of 5 strength with hip flexion, abduction and adduction as well as knee flexion and extension, and ankle dorsi/plantarflexion.  Palpation:  Endorses pain with palpation over Anterior hip flexors, TFL, and within the glutes. Tenderness within right SI Joint. No tenderness to palpation over the greater trochanteric area.    Supine exam: Endorses pain with logroll.  FADIR produces significant deep groin pain. FABER produces posterior pain in right SI Joint.    Thomas test with hip flexor inflexibility on right.     Imaging: Right Hip [place]:  Possible subtle hypoechoity within anterior labrum just adjacent to acetabulum. Hip joint itself with no effusion. No cortical irregularities or spurs.   Assessment & Plan: 35yo F presenting to clinic with right hip pain x51mos, now significant reducing her ability to engage in her hobbies. Concern for possible labral pathology, and limp leading to SI Joint disruption. Given duration of her symptoms, and failure of multiple conservative efforts to date, will order MRI to better evaluate the hip joint. Could consider Injections in the future, pending MRI results. If hip MR unremarkable, would consider investigation into SI  Joint as a source.  Patient expresses understanding with plan. She has no further questions or concerns.      Procedures: No procedures performed        PMFS History: Patient Active Problem List   Diagnosis Date Noted  . Personal history of other diseases of the female genital tract 11/25/2016  . History of other malignant neoplasm of skin 11/25/2016  . Allergic rhinitis 11/25/2016  . Cephalalgia 04/20/2006   Past Medical History:  Diagnosis Date  . Abnormal Pap smear of cervix   . CIN II (cervical intraepithelial neoplasia II)   . Migraine     Family History  Problem  Relation Age of Onset  . Diabetes Mellitus II Maternal Grandmother   . Thyroid cancer Maternal Aunt   . Lung cancer Maternal Grandfather     Past Surgical History:  Procedure Laterality Date  . LEEP    . TONSILLECTOMY    . WISDOM TOOTH EXTRACTION     Social History   Occupational History  . Not on file  Tobacco Use  . Smoking status: Former Smoker    Quit date: 11/24/2007    Years since quitting: 13.3  . Smokeless tobacco: Never Used  Vaping Use  . Vaping Use: Never used  Substance and Sexual Activity  . Alcohol use: Not on file  . Drug use: No  . Sexual activity: Yes    Partners: Male    Birth control/protection: Pill

## 2021-03-25 NOTE — Progress Notes (Signed)
I saw and examined the patient with Dr. Marga Hoots and agree with assessment and plan as outlined.    Chronic right hip pain.  Exam reveals tenderness over right SI joint, and pain with passive hip IR.  Has tried chiro, PT.    No definite labrum tear on ultrasound today.  Will proceed with MRI arthrogram.  Possible injection if labrum torn.  SI treatment if MRI normal.

## 2021-03-26 ENCOUNTER — Encounter: Payer: Self-pay | Admitting: Physical Therapy

## 2021-03-26 DIAGNOSIS — G8929 Other chronic pain: Secondary | ICD-10-CM

## 2021-03-26 DIAGNOSIS — M62838 Other muscle spasm: Secondary | ICD-10-CM

## 2021-03-26 DIAGNOSIS — M25551 Pain in right hip: Secondary | ICD-10-CM

## 2021-03-26 DIAGNOSIS — M546 Pain in thoracic spine: Secondary | ICD-10-CM

## 2021-03-26 NOTE — Therapy (Signed)
[facility] [address] [place], [place], [address] Phone: [phone]   Fax:  [phone]  Physical Therapy No-Visit Discharge Summary Dates of reporting: 01/28/2021 - 03/26/2021  Patient Details  Name: Desiree Rivera MRN: [id number] Date of Birth: 03/22/1986 Referring Provider (PT): Jackson, Stephen D, MD (OBGYN)   Encounter Date: 03/26/2021    Past Medical History:  Diagnosis Date  . Abnormal Pap smear of cervix   . CIN II (cervical intraepithelial neoplasia II)   . Migraine     Past Surgical History:  Procedure Laterality Date  . LEEP    . TONSILLECTOMY    . WISDOM TOOTH EXTRACTION      There were no vitals filed for this visit.   Subjective Assessment - 03/26/21 1252    Subjective After her last visit on 02/17/2021, patient called back and requested discharge due to improvement in symptoms.    Pertinent History Patient is a 34 y.o. female who presents to outpatient physical therapy with a referral for medical diagnosis chronic right-sided low back pain with right-sided sciatica. This patient's chief complaints consist of right sided thoracic spine, rib, shoulder, low back, hip, and leg pain proximal to knee that came on gradually ~ 7 months ago and is constant in nature but intermittent in intensity leading to the following functional deficits:  difficulty with or unable to perform usual activities including sleeping, working, bending, squatting, pushing pulling, sitting, getting in and out of car, running, 5x a week exercise routing including running, lifting, and stretching. She states it has also lead to weight gain which distresses her. Relevant past medical history and comorbidities include tension headaches, migraines, skin cancer removed when 35 years old.  Patient denies hx of stroke, seizures, lung problem, major cardiac events, diabetes, unexplained weight loss, changes in bowel or bladder problems, new  onset stumbling or dropping things, spinal surgeries.    Limitations Sitting;Lifting;Standing;Walking;House hold activities;Other (comment)   difficulty with or unable to perform usual activities including sleeping, working, bending, squatting, pushing pulling, sitting, getting in and out of car, running, 5x a week exercise routing including running, lifting, and stretching.   Diagnostic tests radiograph report for lumbar and thoracic spine and right hip 10/15/2020: "IMPRESSION:  Minimal midthoracic levoconvex scoliosis. Otherwise negative exam."    Patient Stated Goals to get back to usual activities without pain    Pain Onset More than a month ago           OBJECTIVE Patient is not present for examination at this time. Please see previous documentation for latest objective data.     PT Short Term Goals - 02/03/21 1715      PT SHORT TERM GOAL #1   Title Be independent with initial home exercise program for self-management of symptoms.    Baseline to be established at visit 2 (01/28/2021);    Time 3    Period Weeks    Status Achieved    Target Date 02/18/21             PT Long Term Goals - 03/26/21 1252      PT LONG TERM GOAL #1   Title Be independent with a long-term home exercise program for self-management of symptoms.    Baseline Initial HEP to be established at visit 2 (01/28/2021);    Time 12    Period Weeks    Status Achieved   TARGET DATE FOR ALL LONG TERM GOALS: 04/22/2021       PT LONG TERM GOAL #2   Title Demonstrate improved FOTO score by 10 units to demonstrate improvement in overall condition and self-reported functional ability.    Baseline to be measured visit 2 (01/28/2021); 48 (01/30/2021); 63 (03/06/2021);    Time 12    Period Weeks    Status Achieved      PT LONG TERM GOAL #3   Title Have full thoracic and lumbar AROM with no compensations or increase in pain in all planes except intermittent end range discomfort to allow patient to complete valued activities  with less difficulty.    Baseline painful and limited - see objective exam (01/28/2021);    Time 12    Period Weeks    Status Partially Met      PT LONG TERM GOAL #4   Title Reduce pain with functional activities to equal or less than 2/10 to allow patient to complete usual activities including ADLs, IADLs, and social engagement with less difficulty.    Baseline 10/10 (01/28/2021);    Time 12    Period Weeks    Status Partially Met      PT LONG TERM GOAL #5   Title Complete community, work and/or recreational activities without limitation due to current condition    Baseline Functional Limitations: difficulty with or unable to perform usual activities including sleeping, working, bending, squatting, pushing pulling, sitting, getting in and out of car, running, 5x a week exercise routing including running, lifting, and stretching (01/28/2021);    Time 12    Period Weeks    Status Partially Met           .      Plan - 03/26/21 1255    Clinical Impression Statement Patient attended 6 physical therapy sessions and made steady progress during episode of care. She called the office after the 6th session to request discharge due to feeling better. She is now discharged from PT    Personal Factors and Comorbidities Age;Comorbidity 3+;Profession;Past/Current Experience;Fitness;Time since onset of injury/illness/exacerbation    Comorbidities Relevant past medical history and comorbidities include tension headaches, migraines, skin cancer removed when 35 years old.    Examination-Activity Limitations Bed Mobility;Lift;Stairs;Squat;Bend;Locomotion Level;Stand;Reach Overhead;Carry;Transfers;Sit;Sleep    Examination-Participation Restrictions Laundry;Cleaning;Community Activity;Occupation;Driving;Interpersonal Relationship   difficulty with or unable to perform usual activities including sleeping, working, bending, squatting, pushing pulling, sitting, getting in and out of car, running, 5x a week  exercise routing including running, lifting, and stretching.   Stability/Clinical Decision Making Evolving/Moderate complexity    Rehab Potential Good    PT Frequency 2x / week    PT Duration 12 weeks    PT Treatment/Interventions ADLs/Self Care Home Management;Aquatic Therapy;Cryotherapy;Moist Heat;Electrical Stimulation;Neuromuscular re-education;Therapeutic exercise;Therapeutic activities;Patient/family education;Manual techniques;Dry needling;Passive range of motion;Joint Manipulations;Spinal Manipulations;Taping    PT Next Visit Plan DC from PT    PT Home Exercise Plan medbridge Access Code: [email or web address] ; [email or web address]   [id number] Home Exercise Program ([id number])    Consulted and Agree with Plan of Care Patient           Patient will benefit from skilled therapeutic intervention in order to improve the following deficits and impairments:  Increased fascial restricitons,Impaired sensation,Pain,Decreased mobility,Increased muscle spasms,Decreased activity tolerance,Decreased endurance,Decreased range of motion,Decreased strength,Hypomobility,Impaired perceived functional ability,Impaired UE functional use,Difficulty walking  Visit Diagnosis: Chronic right-sided low back pain with right-sided sciatica  Pain in thoracic spine  Pain in right hip  Chronic right shoulder pain  Other muscle spasm     Problem List Patient Active Problem List  Diagnosis Date Noted  . Personal history of other diseases of the female genital tract 11/25/2016  . History of other malignant neoplasm of skin 11/25/2016  . Allergic rhinitis 11/25/2016  . Cephalalgia 04/20/2006    Sara R. Snyder, PT, DPT 03/26/21, 12:55 PM  [facility] [address] [place], [place], [address] Phone: [phone]   Fax:  [phone]  Name: Desiree Rivera MRN: [id number] Date of Birth: 01/24/1986   

## 2021-04-14 ENCOUNTER — Other Ambulatory Visit: Payer: Self-pay

## 2021-04-14 ENCOUNTER — Ambulatory Visit
Admission: RE | Admit: 2021-04-14 | Discharge: 2021-04-14 | Disposition: A | Discharge status: Home / Self Care | Payer: BC Managed Care – PPO | Source: Ambulatory Visit | Attending: Family Medicine | Admitting: Family Medicine

## 2021-04-14 DIAGNOSIS — S73191A Other sprain of right hip, initial encounter: Secondary | ICD-10-CM | POA: Diagnosis not present

## 2021-04-14 DIAGNOSIS — M25559 Pain in unspecified hip: Secondary | ICD-10-CM

## 2021-04-14 DIAGNOSIS — R600 Localized edema: Secondary | ICD-10-CM | POA: Diagnosis not present

## 2021-04-14 MED ORDER — IOPAMIDOL (ISOVUE-M 200) INJECTION 41%
15.0000 mL | Freq: Once | INTRAMUSCULAR | Status: AC
  Administered 2021-04-14: 15 mL via INTRA_ARTICULAR

## 2021-04-16 ENCOUNTER — Telehealth: Payer: Self-pay | Admitting: Family Medicine

## 2021-04-16 NOTE — Telephone Encounter (Signed)
Sent a new MyChart to the patient with options for scheduling an appointment with Dr. Prince Rome next week. Will await a response to that message, or the patient may call in to schedule.

## 2021-04-16 NOTE — Telephone Encounter (Signed)
MRI shows a labrum tear.  Could consider trying a cortisone injection prior to surgical consult.

## 2021-04-25 ENCOUNTER — Other Ambulatory Visit: Payer: Self-pay

## 2021-04-25 ENCOUNTER — Ambulatory Visit (INDEPENDENT_AMBULATORY_CARE_PROVIDER_SITE_OTHER): Payer: BC Managed Care – PPO | Admitting: Family Medicine

## 2021-04-25 ENCOUNTER — Encounter: Payer: Self-pay | Admitting: Family Medicine

## 2021-04-25 DIAGNOSIS — M25551 Pain in right hip: Secondary | ICD-10-CM

## 2021-04-25 DIAGNOSIS — M25559 Pain in unspecified hip: Secondary | ICD-10-CM

## 2021-04-25 MED ORDER — MELOXICAM 15 MG PO TABS
7.5000 mg | ORAL_TABLET | Freq: Every day | ORAL | 6 refills | Status: DC | PRN
Start: 2021-04-25 — End: 2022-10-01

## 2021-04-25 NOTE — Progress Notes (Signed)
   Office Visit Note   Patient: Desiree Rivera           Date of Birth: 22-Feb-1986           MRN: [id number] Visit Date: 04/25/2021 Requested by: Malva Limes, MD [address] [place],  [place] [address] PCP: Malva Limes, MD  Subjective: Chief Complaint  Patient presents with  . Right Hip - Follow-up, Pain    Discuss findings of MR Arthrogram. Pain has lessened around the SI joint with rest and taking the meloxicam and baclofen prn. Saw a different chiropractor for the back and was "almost pain-free" x 1 week.    HPI: He is here for follow-up right hip pain with labral tear diagnosed by MRI scan.  Since last visit she went to a different chiropractor and her pain improved substantially.  She still has some pain in the right SI joint area, and occasionally in the groin area but it is manageable with occasional meloxicam.  She presents with her mother today with some questions about long-term hip health.              ROS:   All other systems were reviewed and are negative.  Objective: Vital Signs: There were no vitals taken for this visit.  Physical Exam:  General:  Alert and oriented, in no acute distress. Pulm:  Breathing unlabored. Psy:  Normal mood, congruent affect  Right hip: She does have a little bit of pain with passive internal rotation.  She is slightly tender over the greater trochanter and moderately tender over the right SI joint.  Imaging: No results found.  Assessment & Plan: 1.  Right hip pain with some symptoms related to SI dysfunction, and some related to labrum tear -She will take glucosamine and turmeric, meloxicam as needed.  If pain gets worse, then a one-time injection.  Hip exercises given today.  Continue with chiropractic as needed.     Procedures: No procedures performed        PMFS History: Patient Active Problem List   Diagnosis Date Noted  . Personal history of other diseases of the female genital tract 11/25/2016   . History of other malignant neoplasm of skin 11/25/2016  . Allergic rhinitis 11/25/2016  . Cephalalgia 04/20/2006   Past Medical History:  Diagnosis Date  . Abnormal Pap smear of cervix   . CIN II (cervical intraepithelial neoplasia II)   . Migraine     Family History  Problem Relation Age of Onset  . Diabetes Mellitus II Maternal Grandmother   . Thyroid cancer Maternal Aunt   . Lung cancer Maternal Grandfather     Past Surgical History:  Procedure Laterality Date  . LEEP    . TONSILLECTOMY    . WISDOM TOOTH EXTRACTION     Social History   Occupational History  . Not on file  Tobacco Use  . Smoking status: Former Smoker    Quit date: 11/24/2007    Years since quitting: 13.4  . Smokeless tobacco: Never Used  Vaping Use  . Vaping Use: Never used  Substance and Sexual Activity  . Alcohol use: Not on file  . Drug use: No  . Sexual activity: Yes    Partners: Male    Birth control/protection: Pill

## 2021-08-14 ENCOUNTER — Other Ambulatory Visit: Payer: Self-pay | Admitting: Family Medicine

## 2021-08-14 DIAGNOSIS — M25551 Pain in right hip: Secondary | ICD-10-CM

## 2021-08-14 DIAGNOSIS — G8929 Other chronic pain: Secondary | ICD-10-CM

## 2021-08-14 NOTE — Telephone Encounter (Signed)
Requested medications are due for refill today yes  Requested medications are on the active medication list yes  Last refill 03/21/21  Last visit 03/21/21  Future visit scheduled no  Notes to clinic Rx has note that pt no longer takes this med, Not Delegated.

## 2022-02-05 IMAGING — XA DG FLUORO GUIDE NDL PLC/BX
1 series · 4 of 4 positions shown · non-contrast
Comparison: none

CLINICAL DATA: Right hip pain.

[Series 2: ortho standard · 2 acquisitions, 4 frames shown]
[im 1/2]
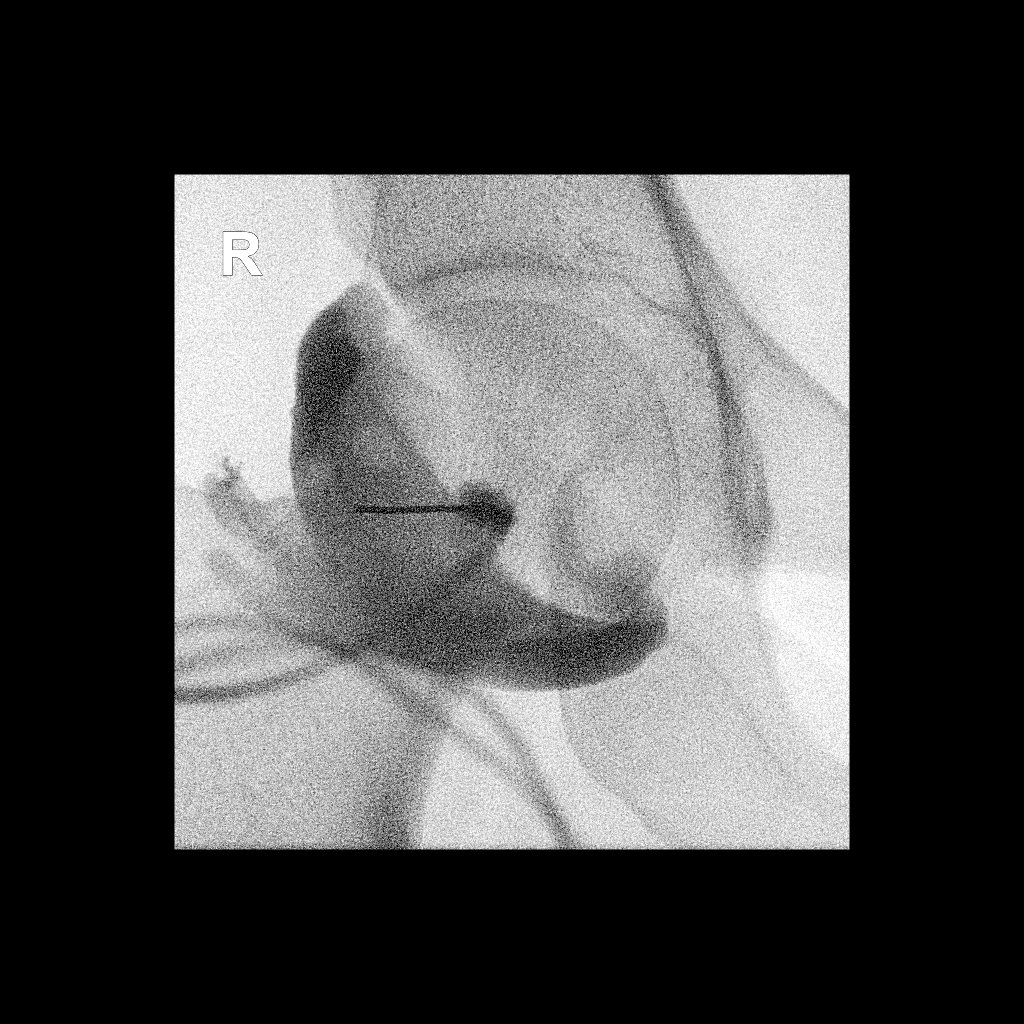
[im 1/2]
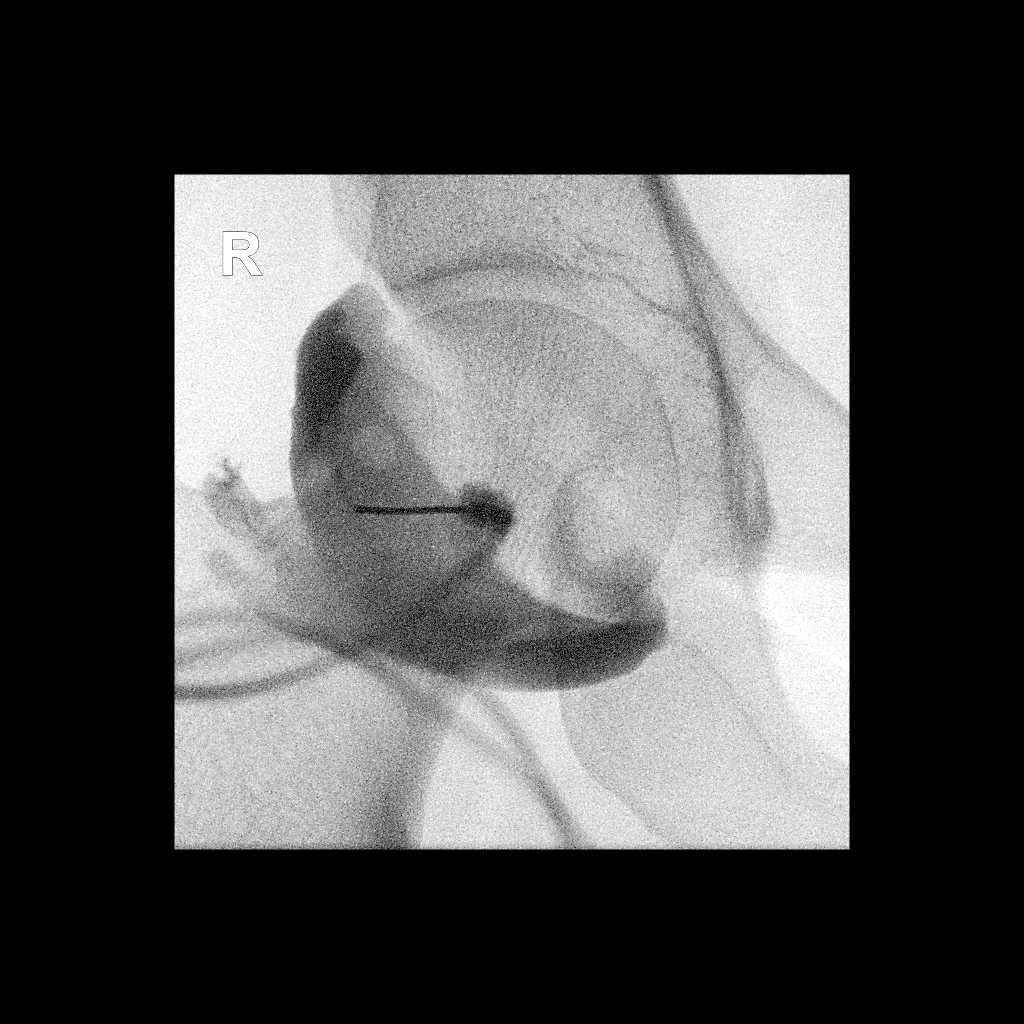
[im 1/2]
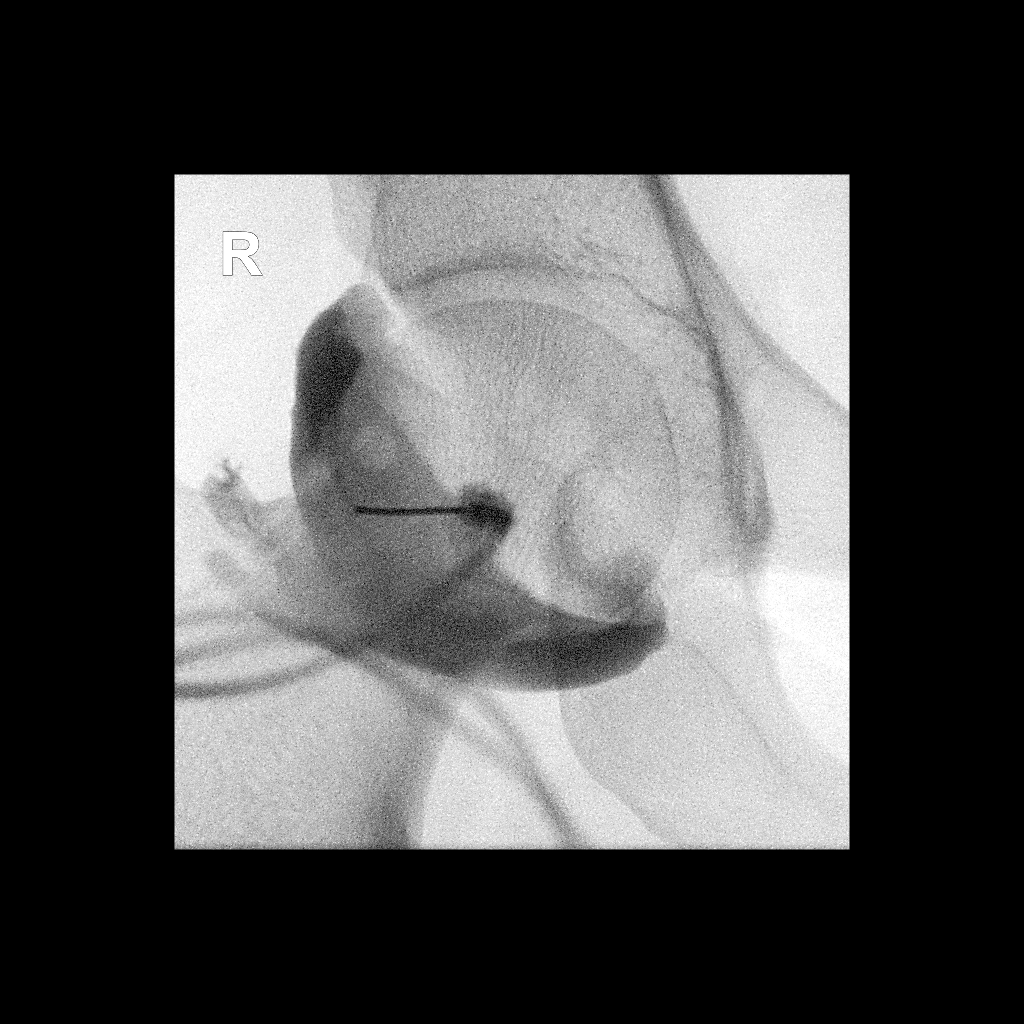
[im 2/2]
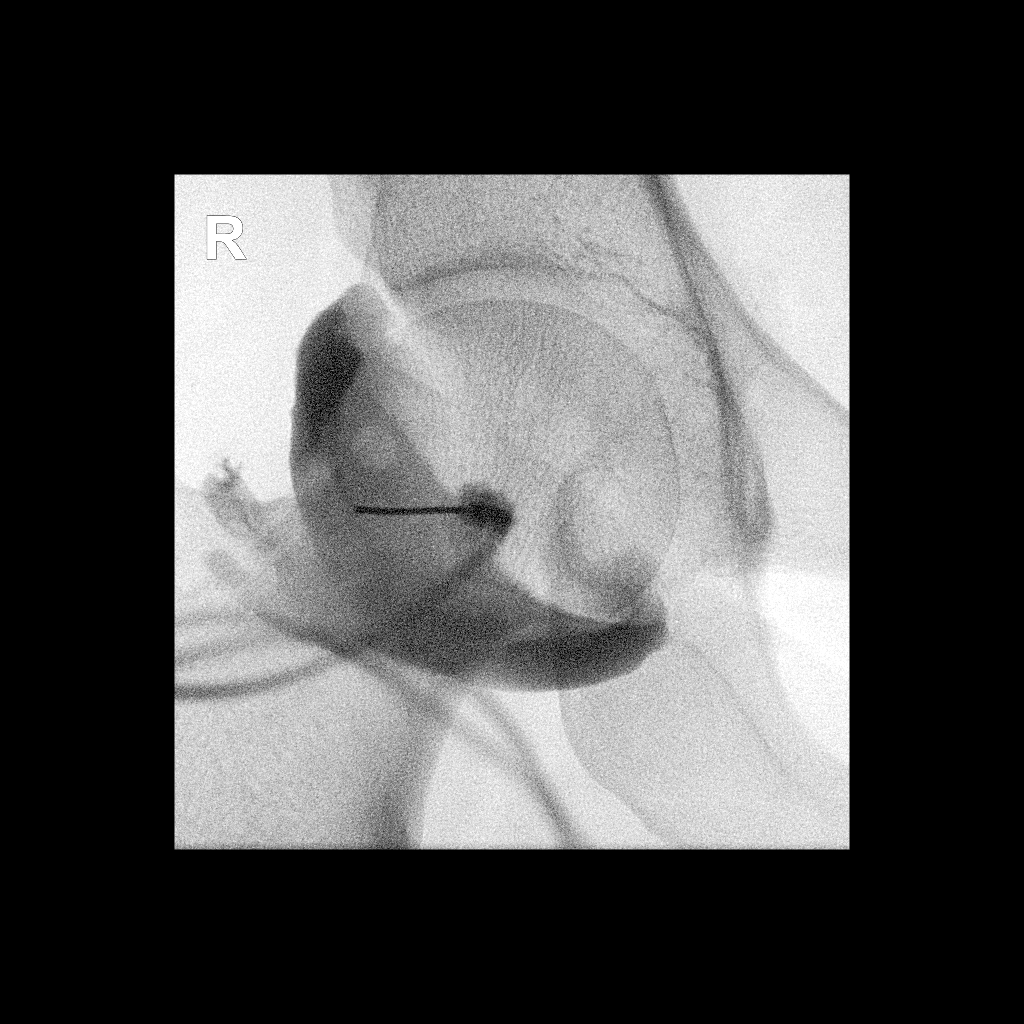

[4 of 4 positions shown; findings below may reference images not displayed]

FLUOROSCOPY TIME:  Radiation Exposure Index (as provided by the
fluoroscopic device): 34.1 uGy*m2

PROCEDURE:
Right HIP INJECTION UNDER FLUOROSCOPY

Procedure was performed by Marielvis Kifer, NP with direct
supervision by me.

An appropriate skin entrance site was determined. The site was
marked, prepped with Betadine, draped in the usual sterile fashion,
and infiltrated locally with 1% Lidocaine. A 22 gauge spinal needle
was advanced to the superolateral margin of the femoral head under
intermittent fluoroscopy. 1 ml of Lidocaine injected easily. A
mixture of 0.1 ml Multihance in 20 ml of dilute Isovue M 200 was
then used to opacify the right hip joint. No immediate complication.
IMPRESSION: Technically successful right hip injection for MRI.

## 2022-02-09 ENCOUNTER — Telehealth: Payer: Self-pay

## 2022-02-09 NOTE — Telephone Encounter (Signed)
Patient is reqyuesting refill on Norgestimate-Ethinyl Estradiol Triphasic (TRI-SPRINTEC) 0.18/0.215/0.25 MG-35 MCG tablet. Patient is scheduled for 03/17/22 with KN

## 2022-02-10 ENCOUNTER — Other Ambulatory Visit: Payer: Self-pay

## 2022-02-10 DIAGNOSIS — Z01419 Encounter for gynecological examination (general) (routine) without abnormal findings: Secondary | ICD-10-CM

## 2022-02-10 DIAGNOSIS — Z3041 Encounter for surveillance of contraceptive pills: Secondary | ICD-10-CM

## 2022-02-10 MED ORDER — NORGESTIM-ETH ESTRAD TRIPHASIC 0.18/0.215/0.25 MG-35 MCG PO TABS: 1.0000 | ORAL_TABLET | Freq: Every day | ORAL | 0 refills | Status: DC

## 2022-02-10 NOTE — Telephone Encounter (Signed)
One refill sent to get to pt annual appt ?

## 2022-02-11 ENCOUNTER — Ambulatory Visit: Payer: BC Managed Care – PPO | Admitting: Family Medicine

## 2022-02-13 ENCOUNTER — Telehealth: Payer: Self-pay

## 2022-02-13 NOTE — Telephone Encounter (Signed)
Pt calling; p/u rx for bc from pharmacy and it's tri estarylla instead of tri-sprintec; did they give her to wrong rx?  [phone] Adv pt they are the same. ?

## 2022-03-17 ENCOUNTER — Ambulatory Visit: Payer: BC Managed Care – PPO | Admitting: Family Medicine

## 2022-03-24 ENCOUNTER — Ambulatory Visit: Payer: BC Managed Care – PPO | Admitting: Licensed Practical Nurse

## 2022-04-08 ENCOUNTER — Other Ambulatory Visit (HOSPITAL_COMMUNITY)
Admission: RE | Admit: 2022-04-08 | Discharge: 2022-04-08 | Disposition: A | Discharge status: Home / Self Care | Payer: BC Managed Care – PPO | Source: Ambulatory Visit | Attending: Licensed Practical Nurse | Admitting: Licensed Practical Nurse

## 2022-04-08 ENCOUNTER — Encounter: Payer: Self-pay | Admitting: Licensed Practical Nurse

## 2022-04-08 ENCOUNTER — Ambulatory Visit (INDEPENDENT_AMBULATORY_CARE_PROVIDER_SITE_OTHER): Payer: BC Managed Care – PPO | Admitting: Licensed Practical Nurse

## 2022-04-08 VITALS — BP 128/62 | Ht 65.0 in | Wt 198.0 lb

## 2022-04-08 DIAGNOSIS — Z13220 Encounter for screening for lipoid disorders: Secondary | ICD-10-CM | POA: Diagnosis not present

## 2022-04-08 DIAGNOSIS — R109 Unspecified abdominal pain: Secondary | ICD-10-CM | POA: Diagnosis not present

## 2022-04-08 DIAGNOSIS — Z124 Encounter for screening for malignant neoplasm of cervix: Secondary | ICD-10-CM | POA: Insufficient documentation

## 2022-04-08 DIAGNOSIS — Z01419 Encounter for gynecological examination (general) (routine) without abnormal findings: Secondary | ICD-10-CM | POA: Diagnosis not present

## 2022-04-08 DIAGNOSIS — Z131 Encounter for screening for diabetes mellitus: Secondary | ICD-10-CM

## 2022-04-08 DIAGNOSIS — Z113 Encounter for screening for infections with a predominantly sexual mode of transmission: Secondary | ICD-10-CM | POA: Diagnosis not present

## 2022-04-08 DIAGNOSIS — R399 Unspecified symptoms and signs involving the genitourinary system: Secondary | ICD-10-CM | POA: Diagnosis not present

## 2022-04-08 DIAGNOSIS — Z808 Family history of malignant neoplasm of other organs or systems: Secondary | ICD-10-CM

## 2022-04-08 LAB — POCT URINALYSIS DIPSTICK
Bilirubin, UA: NEGATIVE
Blood, UA: NEGATIVE
Glucose, UA: NEGATIVE
Ketones, UA: NEGATIVE
Nitrite, UA: NEGATIVE
Protein, UA: POSITIVE — AB
Spec Grav, UA: 1.01 (ref 1.010–1.025)
Urobilinogen, UA: 0.2 E.U./dL
pH, UA: 5 (ref 5.0–8.0)

## 2022-04-09 LAB — CBC
Hematocrit: 36.8 % (ref 34.0–46.6)
Hemoglobin: 12.9 g/dL (ref 11.1–15.9)
MCH: 33.1 pg — ABNORMAL HIGH (ref 26.6–33.0)
MCHC: 35.1 g/dL (ref 31.5–35.7)
MCV: 94 fL (ref 79–97)
Platelets: 268 10*3/uL (ref 150–450)
RBC: 3.9 x10E6/uL (ref 3.77–5.28)
RDW: 12 % (ref 11.7–15.4)
WBC: 10.4 10*3/uL (ref 3.4–10.8)

## 2022-04-09 LAB — HEP, RPR, HIV PANEL
HIV Screen 4th Generation wRfx: NONREACTIVE
Hepatitis B Surface Ag: NEGATIVE
RPR Ser Ql: NONREACTIVE

## 2022-04-09 LAB — LIPID PANEL
Chol/HDL Ratio: 1.8 ratio (ref 0.0–4.4)
Cholesterol, Total: 206 mg/dL — ABNORMAL HIGH (ref 100–199)
HDL: 116 mg/dL (ref >39)
LDL Chol Calc (NIH): 80 mg/dL (ref 0–99)
Triglycerides: 54 mg/dL (ref 0–149)
VLDL Cholesterol Cal: 10 mg/dL (ref 5–40)

## 2022-04-09 LAB — COMPREHENSIVE METABOLIC PANEL
ALT: 8 IU/L (ref 0–32)
AST: 20 IU/L (ref 0–40)
Albumin/Globulin Ratio: 2 (ref 1.2–2.2)
Albumin: 4.5 g/dL (ref 3.8–4.8)
Alkaline Phosphatase: 69 IU/L (ref 44–121)
BUN/Creatinine Ratio: 13 (ref 9–23)
BUN: 10 mg/dL (ref 6–20)
Bilirubin Total: 0.2 mg/dL (ref 0.0–1.2)
CO2: 25 mmol/L (ref 20–29)
Calcium: 9.5 mg/dL (ref 8.7–10.2)
Chloride: 101 mmol/L (ref 96–106)
Creatinine, Ser: 0.78 mg/dL (ref 0.57–1.00)
Globulin, Total: 2.3 g/dL (ref 1.5–4.5)
Glucose: 89 mg/dL (ref 70–99)
Potassium: 4 mmol/L (ref 3.5–5.2)
Sodium: 139 mmol/L (ref 134–144)
Total Protein: 6.8 g/dL (ref 6.0–8.5)
eGFR: 102 mL/min/{1.73_m2} (ref >59)

## 2022-04-09 LAB — TSH+FREE T4
Free T4: 1.13 ng/dL (ref 0.82–1.77)
TSH: 2.74 u[IU]/mL (ref 0.450–4.500)

## 2022-04-09 LAB — HEMOGLOBIN A1C
Est. average glucose Bld gHb Est-mCnc: 100 mg/dL
Hgb A1c MFr Bld: 5.1 % (ref 4.8–5.6)

## 2022-04-10 ENCOUNTER — Encounter: Payer: Self-pay | Admitting: Licensed Practical Nurse

## 2022-04-10 LAB — CYTOLOGY - PAP
Chlamydia: NEGATIVE
Comment: NEGATIVE
Comment: NEGATIVE
Comment: NORMAL
Diagnosis: NEGATIVE
Neisseria Gonorrhea: NEGATIVE
Trichomonas: NEGATIVE

## 2022-04-10 LAB — URINE CULTURE

## 2022-04-10 NOTE — Progress Notes (Signed)
Gynecology Annual Exam   PCP: Birdie Sons, MD  Chief Complaint:  Chief Complaint  Patient presents with   Gynecologic Exam    History of Present Illness: Patient is a 36 y.o. G3P0030 presents for annual exam. The patient has had on going right sided pain along her ribs, the pain goes from the front to the back, that area is tender to touch. Has had various images d/t hip injury but has not had an abdominal US. Denies other urinary symptoms. Denies any injuries or falls to that area.   Has an itchy area above her clitoris, notices it after shaving, lasts a few days and then goes away, denies history of HSV   Does not desire children, thinking about a tubal but is concerned that she will not be able to get one as she does not have any children.   LMP: Patient's last menstrual period was 04/08/2022 (exact date). Average Interval: regular, monthly Duration of flow:  4 to 5  days Heavy Menses: on day 1 only Clots: no Intermenstrual Bleeding: no Postcoital Bleeding: no Dysmenorrhea: no  The patient is sexually active with 1 female partner. She currently uses OCP (estrogen/progesterone) for contraception. She denies dyspareunia.  The patient does not perform self breast exams.  There is no notable family history of breast or ovarian cancer in her family.  The patient wears seatbelts: yes.   The patient has regular exercise:  injured her hip a while ago, has not been able to exercise since .    The patient denies current symptoms of depression.  Has put on weight since she has not been able to exercise, this has been bringing  her down. Otherwise denies depression  Works as a Chartered certified accountant for a [employer], is in her car a lot for work and also physically demanding drinks 2 glasses of wine a day Lives with her husband and dog, feels safe  Review of Systems: Review of Systems  Constitutional: Negative.   Respiratory: Negative.    Cardiovascular: Negative.    Gastrointestinal: Negative.   Genitourinary: Negative.   Musculoskeletal:  Positive for joint pain. Negative for falls.  Neurological: Negative.   Endo/Heme/Allergies: Negative.   Psychiatric/Behavioral: Negative.     Past Medical History:  Patient Active Problem List   Diagnosis Date Noted   Personal history of other diseases of the female genital tract 11/25/2016   History of other malignant neoplasm of skin 11/25/2016    on nose     Allergic rhinitis 11/25/2016   Cephalalgia 04/20/2006    Past Surgical History:  Past Surgical History:  Procedure Laterality Date   LEEP     TONSILLECTOMY     WISDOM TOOTH EXTRACTION      Gynecologic History:  Patient's last menstrual period was 04/08/2022 (exact date). Contraception: OCP (estrogen/progesterone) Last Pap: Results were: 06/2019 no abnormalities   Obstetric History: G3P0030  Family History:  Family History  Problem Relation Age of Onset   Diabetes Mellitus II Maternal Grandmother    Thyroid cancer Maternal Aunt    Lung cancer Maternal Grandfather     Social History:  Social History   Socioeconomic History   Marital status: Married    Spouse name: Not on file   Number of children: Not on file   Years of education: Not on file   Highest education level: Not on file  Occupational History   Not on file  Tobacco Use   Smoking status: Former  Types: Cigarettes    Quit date: 11/24/2007    Years since quitting: 14.3   Smokeless tobacco: Never  Vaping Use   Vaping Use: Never used  Substance and Sexual Activity   Alcohol use: Not on file   Drug use: No   Sexual activity: Yes    Partners: Male    Birth control/protection: Pill  Other Topics Concern   Not on file  Social History Narrative   Not on file   Social Determinants of Health   Financial Resource Strain: Not on file  Food Insecurity: Not on file  Transportation Needs: Not on file  Physical Activity: Not on file  Stress: Not on file  Social  Connections: Not on file  Intimate Partner Violence: Not on file    Allergies:  Allergies  Allergen Reactions   Sulfa Antibiotics Rash    Medications: Prior to Admission medications   Medication Sig Start Date End Date Taking? Authorizing Provider  methocarbamol (ROBAXIN) 500 MG tablet TAKE 1 TABLET BY MOUTH 4 TIMES DAILY. 08/22/21  Yes Birdie Sons, MD  MULTIPLE VITAMIN PO Take by mouth.   Yes [provider]  Norgestimate-Ethinyl Estradiol Triphasic (TRI-SPRINTEC) 0.18/0.215/0.25 MG-35 MCG tablet Take 1 tablet by mouth daily. 02/10/22  Yes Caren Macadam, MD  baclofen (LIORESAL) 10 MG tablet Take 0.5-1 tablets (5-10 mg total) by mouth at bedtime as needed for muscle spasms. Patient not taking: Reported on 04/08/2022 03/25/21   Hilts, Legrand Como, MD  meloxicam (MOBIC) 15 MG tablet Take 0.5-1 tablets (7.5-15 mg total) by mouth daily as needed for pain. Patient not taking: Reported on 04/08/2022 04/25/21   Hilts, Legrand Como, MD    Physical Exam Vitals: Blood pressure 128/62, height 5\' 5"  (1.651 m), weight 198 lb (89.8 kg), last menstrual period 04/08/2022.  General: NAD HEENT: normocephalic, anicteric Thyroid: no enlargement, no palpable nodules Pulmonary: No increased work of breathing, CTAB Cardiovascular: RRR, distal pulses 2+ Breast: Breast symmetrical, no tenderness, no palpable nodules or masses, no skin or nipple retraction present, no nipple discharge.  No axillary or supraclavicular lymphadenopathy. Abdomen: NABS, soft, non-tender, non-distended.  Umbilicus without lesions.  No hepatomegaly, splenomegaly or masses palpable. No evidence of hernia  Right ribcage tender with palpation.  Genitourinary:  External: Normal external female genitalia.  Normal urethral meatus, normal Bartholin's and Skene's glands.  Clean shaven, area above clitoris without redness or swelling, no lesions  Vagina: Normal vaginal mucosa, no evidence of prolapse.    Cervix: Grossly normal in  appearance, no bleeding  Uterus: Non-enlarged, mobile, normal contour.  No CMT  Adnexa: ovaries non-enlarged, no adnexal masses  Rectal: deferred  Lymphatic: no evidence of inguinal lymphadenopathy Extremities: no edema, erythema, or tenderness Neurologic: Grossly intact Psychiatric: mood appropriate, affect full   Assessment: 36 y.o. G3P0030 routine annual exam  Plan: Problem List Items Addressed This Visit   None Visit Diagnoses     Well woman exam    -  Primary   Relevant Orders   HEP, RPR, HIV Panel (Completed)   CBC (Completed)   Comprehensive metabolic panel (Completed)   Hemoglobin A1c (Completed)   Lipid panel (Completed)   TSH + free T4 (Completed)   Cytology - PAP (Completed)   Urine Culture (Completed)   UTI symptoms       Relevant Orders   POCT Urinalysis Dipstick (Completed)   Family history of thyroid cancer       Screen for sexually transmitted diseases       Relevant Orders  HEP, RPR, HIV Panel (Completed)   Cytology - PAP (Completed)   Cervical cancer screening       Relevant Orders   Cytology - PAP (Completed)   Right sided abdominal pain       Relevant Orders   Comprehensive metabolic panel (Completed)   Screening for diabetes mellitus       Screening cholesterol level       Relevant Orders   Lipid panel (Completed)   Right flank pain       Relevant Orders   Urine Culture (Completed)       2) STI screening  wasoffered and accepted  2)  ASCCP guidelines and rational discussed.  Patient opts for every 5 years screening interval  3) Contraception - the patient is currently using  OCP (estrogen/progesterone).  She is happy with her current form of contraception and plans to continue.  Is considering having BTL as she is certain she does not want children, will call and schedule consult if desired.   4) Routine healthcare maintenance including cholesterol, diabetes screening discussed Ordered today  5) Right sided pain: will do labs, rec  seeing PCP for further evaluation   Roberto Scales, [place], [facility] 04/10/2022, 1:49 PM

## 2022-05-18 ENCOUNTER — Other Ambulatory Visit: Payer: Self-pay | Admitting: Family Medicine

## 2022-05-18 DIAGNOSIS — Z01419 Encounter for gynecological examination (general) (routine) without abnormal findings: Secondary | ICD-10-CM

## 2022-05-18 DIAGNOSIS — Z3041 Encounter for surveillance of contraceptive pills: Secondary | ICD-10-CM

## 2022-05-18 MED ORDER — NORGESTIM-ETH ESTRAD TRIPHASIC 0.18/0.215/0.25 MG-35 MCG PO TABS: 1.0000 | ORAL_TABLET | Freq: Every day | ORAL | 0 refills | Status: DC

## 2022-06-03 ENCOUNTER — Telehealth: Payer: Self-pay | Admitting: *Deleted

## 2022-06-03 NOTE — Telephone Encounter (Signed)
Message left of cancellation for Monday if she is interested.

## 2022-06-19 ENCOUNTER — Ambulatory Visit: Payer: BC Managed Care – PPO | Admitting: Physician Assistant

## 2022-06-22 NOTE — Progress Notes (Deleted)
     I,Sha'taria Janelis Stelzer,acting as a Neurosurgeon for [employer], PA-C.,have documented all relevant documentation on the behalf of Alfredia Ferguson, PA-C,as directed by  Alfredia Ferguson, PA-C while in the presence of Alfredia Ferguson, PA-C.   Established patient visit   Patient: Desiree Rivera   DOB: January 31, 1986   36 y.o. Female  MRN: [id number] Visit Date: 06/23/2022  Today's healthcare provider: Alfredia Ferguson, PA-C   No chief complaint on file.  Subjective    Hip Pain  The pain is present in the right hip. The pain is mild.   ***  Medications: Outpatient Medications Prior to Visit  Medication Sig  . baclofen (LIORESAL) 10 MG tablet Take 0.5-1 tablets (5-10 mg total) by mouth at bedtime as needed for muscle spasms. (Patient not taking: Reported on 04/08/2022)  . meloxicam (MOBIC) 15 MG tablet Take 0.5-1 tablets (7.5-15 mg total) by mouth daily as needed for pain. (Patient not taking: Reported on 04/08/2022)  . methocarbamol (ROBAXIN) 500 MG tablet TAKE 1 TABLET BY MOUTH 4 TIMES DAILY.  . MULTIPLE VITAMIN PO Take by mouth.  . Norgestimate-Ethinyl Estradiol Triphasic (TRI-SPRINTEC) 0.18/0.215/0.25 MG-35 MCG tablet Take 1 tablet by mouth daily.   No facility-administered medications prior to visit.    Review of Systems  {Labs  Heme  Chem  Endocrine  Serology  Results Review (optional):23779}   Objective    There were no vitals taken for this visit. {Show previous vital signs (optional):23777}  Physical Exam  ***  No results found for any visits on 06/23/22.  Assessment & Plan     ***  No follow-ups on file.      {provider attestation***:1}   Alfredia Ferguson, PA-C  [facility] [place] [facility] [phone] (phone) [phone] (fax)  [facility]

## 2022-06-23 ENCOUNTER — Ambulatory Visit: Payer: BC Managed Care – PPO | Admitting: Physician Assistant

## 2022-08-11 ENCOUNTER — Other Ambulatory Visit: Payer: Self-pay | Admitting: Family Medicine

## 2022-08-11 DIAGNOSIS — Z01419 Encounter for gynecological examination (general) (routine) without abnormal findings: Secondary | ICD-10-CM

## 2022-08-11 DIAGNOSIS — Z3041 Encounter for surveillance of contraceptive pills: Secondary | ICD-10-CM

## 2022-08-13 ENCOUNTER — Other Ambulatory Visit: Payer: Self-pay | Admitting: Family Medicine

## 2022-08-13 DIAGNOSIS — Z3041 Encounter for surveillance of contraceptive pills: Secondary | ICD-10-CM

## 2022-08-13 DIAGNOSIS — Z01419 Encounter for gynecological examination (general) (routine) without abnormal findings: Secondary | ICD-10-CM

## 2022-08-13 MED ORDER — NORGESTIM-ETH ESTRAD TRIPHASIC 0.18/0.215/0.25 MG-35 MCG PO TABS: 1.0000 | ORAL_TABLET | Freq: Every day | ORAL | 3 refills | Status: DC

## 2022-09-24 ENCOUNTER — Ambulatory Visit: Payer: BC Managed Care – PPO | Admitting: Sports Medicine

## 2022-09-24 ENCOUNTER — Encounter: Payer: Self-pay | Admitting: Sports Medicine

## 2022-09-24 DIAGNOSIS — G8929 Other chronic pain: Secondary | ICD-10-CM

## 2022-09-24 DIAGNOSIS — M25551 Pain in right hip: Secondary | ICD-10-CM

## 2022-09-24 DIAGNOSIS — M533 Sacrococcygeal disorders, not elsewhere classified: Secondary | ICD-10-CM | POA: Diagnosis not present

## 2022-09-24 DIAGNOSIS — S73191S Other sprain of right hip, sequela: Secondary | ICD-10-CM

## 2022-09-24 MED ORDER — CELECOXIB 100 MG PO CAPS: 100.0000 mg | ORAL_CAPSULE | Freq: Two times a day (BID) | ORAL | 1 refills | Status: DC

## 2022-09-24 NOTE — Progress Notes (Signed)
Desiree Rivera - 36 y.o. female MRN [id number]  Date of birth: 02-04-1986  Office Visit Note: Visit Date: 09/24/2022 PCP: Malva Limes, MD Referred by: Malva Limes, MD  Subjective: No chief complaint on file.  HPI: Desiree Rivera is a pleasant 36 y.o. female who presents today for acute on chronic right hip pain, right SI joint pain.  Patient was previously a patient of Dr. Prince Rome.  Had an MRI of the right hip with contrast on 04/14/2021 which revealed an anterior labral tear from the 2:00 to 5 o'clock position.  There is some additional undersurface signal change of the posterior superior labrum as well as gluteus minimus tendinopathy.  In the past, they considered a ultrasound-guided injection for both diagnostic and therapeutic purposes, however her pain had been manageable with meloxicam, baclofen as needed as well as physical therapy and seeing a chiropractor.  Over the last year or more her pain has continued to worsen.  Her pain today is more so over the lateral hip and the posterior SI joint.  She does have some pain that radiates into the groin, although this is possibly less severe than previously.  Meloxicam, glucosamine and turmeric questionably helping at this point.  At times her pain will radiate down the buttock and the posterior thigh, although denies any numbness or tingling extending past the leg.  She would like to remain active as well as run, however this does worsen her symptoms.  Pertinent ROS were reviewed with the patient and found to be negative unless otherwise specified above in HPI.   Assessment & Plan: Visit Diagnoses:  1. Tear of right acetabular labrum, sequela   2. Chronic right hip pain   3. Sacroiliac joint dysfunction of right side   4. Greater trochanteric pain syndrome of right lower extremity    Plan: Had a good discussion with Calirose today regarding her right hip and SI joint pain.  I do feel like her pain likely started with her  labral tear, although I am unsure if this is the main source of her pain currently.  She certainly has SI joint pain and dysfunction.  More of her pain today is over the greater trochanter and the gluteus muscles.  She has weakness with hip abduction as well as pain associated with this.  She has failed conservative measures for the right hip to this point.  I would like to do a trial of extracorporeal shockwave therapy for the greater trochanter and the gluteus muscles to see if this response to this where she can get back into doing rehab.  We did review with my athletic trainer her hip stabilization exercises which she will attempt once daily.  We did discuss the role of a diagnostic and therapeutic injection either over the greater trochanter and/or at some point the hip joint itself, especially if she is to ever want to undergo labral hip surgery, I would want to ensure that her pain is emanating from this and would likely benefit from an injection into the hip.  We will hold off on this for now and have her follow-up in 1 week for shockwave therapy.  We will transition her off of meloxicam and onto Celebrex 100 mg twice daily as needed.  Follow-up: Return if symptoms worsen or fail to improve, for Schedule appt when works for patient for follow-up with Dr. Shon Baton for hip (ECSWT).   Meds & Orders:  Meds ordered this encounter  Medications   celecoxib (CELEBREX)  100 MG capsule    Sig: Take 1 capsule (100 mg total) by mouth 2 (two) times daily.    Dispense:  60 capsule    Refill:  1   No orders of the defined types were placed in this encounter.    Procedures: No procedures performed      Clinical History: No specialty comments available.  She reports that she quit smoking about 14 years ago. She has never used smokeless tobacco.  Recent Labs    04/08/22 1450  HGBA1C 5.1    Objective:   Vital Signs: There were no vitals taken for this visit.  Physical Exam  Gen: Well-appearing, in  no acute distress; non-toxic CV: Regular Rate. Well-perfused. Warm.  Resp: Breathing unlabored on room air; no wheezing. Psych: Fluid speech in conversation; appropriate affect; normal thought process Neuro: Sensation intact throughout. No gross coordination deficits.   Ortho Exam - Right hip: There is TTP over the greater trochanter as well as the right posterior SI joint.  She has full flexion about the hip, FABER causes reproduction of pain.  Very mild pain with FADIR testing, although to a lesser extent.  She is about 10 degrees of left internal rotation compared to the left hip.  There is weakness as well as weakness to pain with resisted hip abduction of the right hip, otherwise full strength.   Imaging:  *Independent review of the MR arthrogram of the right hip from 04/14/2021 was independently reviewed by myself.  Clear evidence of an anterior superior labral tear.  Some signal change in the posterior superior labrum, unclear if this is a tear or not.  MR Hip Right w/ contrast CLINICAL DATA:  Hip pain, chronic, labral tear suspected, xray done  EXAM: MRI OF THE RIGHT HIP WITH CONTRAST (MR Arthrogram)  TECHNIQUE: Multiplanar, multisequence MR imaging of the hip was performed immediately following contrast injection into the hip joint under fluoroscopic guidance. No intravenous contrast was administered.  COMPARISON:  Radiograph 10/15/2020  FINDINGS: Bones: There is no evidence of acute fracture, dislocation or avascular necrosis. No focal bone lesion. The visualized sacroiliac joints and symphysis pubis appear normal.  Articular cartilage and labrum  Articular cartilage: No focal chondral defect or subchondral signal abnormality identified.  Labrum: There is contrast undercutting of the anterior labrum with linear signal extending into the labral substance from proximally 2 o'clock-5 o'clock (axial oblique images 14-18). There is also contrast undercutting of the  posterosuperior labrum, which extends approximately 50% the width at the chondrolabral junction, is smooth, and without definite adjacent chondral abnormality (coronal T1 image 10-14). No paralabral cyst formation.  Joint or bursal effusion  Joint effusion: Adequate distention of the hip joint by arthrogram technique.  Bursae: There is very mild edema signal at the gluteus minimus attachment on the greater trochanter lateral aspect, likely mild bursitis.  Muscles and tendons  Muscles and tendons: The visualized gluteus, hamstring and iliopsoas tendons appear normal. No muscle edema or atrophy.  Other findings  Miscellaneous: The visualized internal pelvic contents appear unremarkable.  IMPRESSION: Nondisplaced anterior labral tear from 2 o'clock-5 o'clock. Additional undercutting of the posterosuperior labrum which could represent a sublabral sulcus versus small undersurface tear at the chondrolabral junction.  Mild edema signal at the gluteus minimus attachment on the greater trochanter lateral aspect, likely mild bursitis.  Electronically Signed   By: Maurine Simmering   On: 04/16/2021 10:52    Past Medical/Family/Surgical/Social History: Medications & Allergies reviewed per EMR, new medications updated. Patient Active  Problem List   Diagnosis Date Noted   Personal history of other diseases of the female genital tract 11/25/2016   History of other malignant neoplasm of skin 11/25/2016   Allergic rhinitis 11/25/2016   Cephalalgia 04/20/2006   Past Medical History:  Diagnosis Date   Abnormal Pap smear of cervix    CIN II (cervical intraepithelial neoplasia II)    Migraine    Family History  Problem Relation Age of Onset   Diabetes Mellitus II Maternal Grandmother    Thyroid cancer Maternal Aunt    Lung cancer Maternal Grandfather    Past Surgical History:  Procedure Laterality Date   LEEP     TONSILLECTOMY     WISDOM TOOTH EXTRACTION     Social History    Occupational History   Not on file  Tobacco Use   Smoking status: Former    Types: Cigarettes    Quit date: 11/24/2007    Years since quitting: 14.8   Smokeless tobacco: Never  Vaping Use   Vaping Use: Never used  Substance and Sexual Activity   Alcohol use: Not on file   Drug use: No   Sexual activity: Yes    Partners: Male    Birth control/protection: Pill

## 2022-09-24 NOTE — Progress Notes (Signed)
Right hip, lower back, leg, rib pain 3 years of this pain that has not gotten any better. Some days are worse than others. The pain stays at a constant 2. She has tried OTC ibuprofen/tylenol She has also tried formal PT, with not much relief  Has had meloxicam in the past that did not seem to help much. Has not had an injection into the hip  States the pain keeps her up at night when it is really bad

## 2022-10-01 ENCOUNTER — Ambulatory Visit (INDEPENDENT_AMBULATORY_CARE_PROVIDER_SITE_OTHER): Payer: BC Managed Care – PPO | Admitting: Sports Medicine

## 2022-10-01 ENCOUNTER — Encounter: Payer: Self-pay | Admitting: Sports Medicine

## 2022-10-01 DIAGNOSIS — M25551 Pain in right hip: Secondary | ICD-10-CM

## 2022-10-01 NOTE — Progress Notes (Signed)
   Procedure Note  Patient: Desiree Rivera             Date of Birth: 1986/06/04           MRN: [id number]             Visit Date: 10/01/2022  Procedures: Visit Diagnoses:  1. Greater trochanteric pain syndrome of right lower extremity   2. Pain of right hip    Procedure: ECSWT Indications:  Greater trochanteric pain    Procedure Details Consent: Risks of procedure as well as the alternatives and risks of each were explained to the patient.  Verbal consent for procedure obtained. Time Out: Verified patient identification, verified procedure, site was marked, verified correct patient position. The area was cleaned with alcohol swab.     The right greater trochanteric region and gluteus medius/minimus tendons was targeted for Extracorporeal shockwave therapy.    Preset: Trochanteric bursitis Power Level: 110 MJ Frequency: 10 Hz Impulse/cycles: 2500 Head size: Regular   Patient tolerated procedure well without immediate complications.   -Patient will follow-up in 1 week for additional shockwave treatment if finds beneficial  **This was a no charge visit, as she was simply doing a trial of shockwave as we previously discussed.  Madelyn Brunner, DO Primary Care Sports Medicine Physician  [facility] - Orthopedics  This note was dictated using Dragon naturally speaking software and may contain errors in syntax, spelling, or content which have not been identified prior to signing this note.

## 2022-10-05 ENCOUNTER — Ambulatory Visit: Payer: BC Managed Care – PPO | Admitting: Sports Medicine

## 2022-10-08 ENCOUNTER — Ambulatory Visit: Payer: BC Managed Care – PPO | Admitting: Sports Medicine

## 2022-10-09 ENCOUNTER — Ambulatory Visit: Payer: BC Managed Care – PPO | Admitting: Sports Medicine

## 2022-10-09 ENCOUNTER — Encounter: Payer: Self-pay | Admitting: Sports Medicine

## 2022-10-09 DIAGNOSIS — M25551 Pain in right hip: Secondary | ICD-10-CM

## 2022-10-09 NOTE — Progress Notes (Signed)
Doing good; has actually felt some relief for the first time in 3 years Feels like the first treatment helped. Still having SI joint pain, but tolerable

## 2022-10-09 NOTE — Progress Notes (Signed)
   Procedure Note  Patient: Desiree Rivera             Date of Birth: [birth year]           MRN: [id number]             Visit Date: 10/09/2022  Procedures: Visit Diagnoses:  1. Greater trochanteric pain syndrome of right lower extremity   2. Pain of right hip    Procedure: ECSWT Indications:  Greater trochanteric pain    Procedure Details Consent: Risks of procedure as well as the alternatives and risks of each were explained to the patient.  Verbal consent for procedure obtained. Time Out: Verified patient identification, verified procedure, site was marked, verified correct patient position. The area was cleaned with alcohol swab.     The right greater trochanteric region and gluteus medius/minimus tendons was targeted for Extracorporeal shockwave therapy.    Preset: Trochanteric bursitis Power Level: 110 MJ Frequency: 10 Hz Impulse/cycles: 2500 Head size: Regular   Patient tolerated procedure well without immediate complications.  Madelyn Brunner, DO Primary Care Sports Medicine Physician  [facility] - Orthopedics  This note was dictated using Dragon naturally speaking software and may contain errors in syntax, spelling, or content which have not been identified prior to signing this note.

## 2022-10-12 ENCOUNTER — Ambulatory Visit: Payer: BC Managed Care – PPO | Admitting: Sports Medicine

## 2022-10-19 ENCOUNTER — Ambulatory Visit: Payer: BC Managed Care – PPO | Admitting: Sports Medicine

## 2022-10-19 ENCOUNTER — Encounter: Payer: Self-pay | Admitting: Sports Medicine

## 2022-10-19 DIAGNOSIS — M25551 Pain in right hip: Secondary | ICD-10-CM

## 2022-10-19 DIAGNOSIS — M9908 Segmental and somatic dysfunction of rib cage: Secondary | ICD-10-CM

## 2022-10-19 NOTE — Progress Notes (Signed)
Doing good Feels like these treatments are helping her

## 2022-10-19 NOTE — Progress Notes (Signed)
   Procedure Note  Patient: Desiree Rivera             Date of Birth: 07-15-1986           MRN: [id number]             Visit Date: 10/19/2022  HPI: Desiree Rivera is a pleasant 36 year-old female who presents for ECSWT #3 for the lateral hip/greater trochanter. She is finding benefit.   Procedures: Visit Diagnoses:  1. Greater trochanteric pain syndrome of right lower extremity   2. Pain of right hip    Procedure: ECSWT Indications:  Greater trochanteric pain    Procedure Details Consent: Risks of procedure as well as the alternatives and risks of each were explained to the patient.  Verbal consent for procedure obtained. Time Out: Verified patient identification, verified procedure, site was marked, verified correct patient position. The area was cleaned with alcohol swab.     The right greater trochanteric region and gluteus medius/minimus tendons was targeted for Extracorporeal shockwave therapy.    Preset: Trochanteric bursitis Power Level: 120 MJ Frequency: 10 Hz Impulse/cycles: 3000 Head size: Regular   Patient tolerated procedure well without immediate complications.  - referral sent for OMT referral to Dr. [name] for right lower ribs somatic dysfunction, lower cross syndrome - f/u in 1 week for hip f/u and repeat ECSWT

## 2022-10-26 ENCOUNTER — Ambulatory Visit: Payer: BC Managed Care – PPO | Admitting: Sports Medicine

## 2022-10-28 NOTE — Progress Notes (Unsigned)
    [name] D.[name] [address] [place] [address] Phone: [phone]   Assessment and Plan:     There are no diagnoses linked to this encounter.  ***   Pertinent previous records reviewed include ***   Follow Up: ***     Subjective:   I, Zoelle Markus, am serving as a Neurosurgeon for Doctor Richardean Sale  Chief Complaint: rib pain   HPI:   10/29/2022 Patient is a 36 year old female complaining of rib pain. Patient states   Relevant Historical Information: ***  Additional pertinent review of systems negative.   Current Outpatient Medications:    baclofen (LIORESAL) 10 MG tablet, Take 0.5-1 tablets (5-10 mg total) by mouth at bedtime as needed for muscle spasms. (Patient not taking: Reported on 04/08/2022), Disp: 30 each, Rfl: 3   celecoxib (CELEBREX) 100 MG capsule, Take 1 capsule (100 mg total) by mouth 2 (two) times daily., Disp: 60 capsule, Rfl: 1   methocarbamol (ROBAXIN) 500 MG tablet, TAKE 1 TABLET BY MOUTH 4 TIMES DAILY., Disp: 30 tablet, Rfl: 4   MULTIPLE VITAMIN PO, Take by mouth., Disp: , Rfl:    Norgestimate-Ethinyl Estradiol Triphasic (TRI-SPRINTEC) 0.18/0.215/0.25 MG-35 MCG tablet, Take 1 tablet by mouth daily., Disp: 84 tablet, Rfl: 3   Objective:     There were no vitals filed for this visit.    There is no height or weight on file to calculate BMI.    Physical Exam:    ***   Electronically signed by:  [name] D.[name] 8:27 AM 10/28/22

## 2022-10-29 ENCOUNTER — Ambulatory Visit: Payer: BC Managed Care – PPO | Admitting: Sports Medicine

## 2022-10-29 VITALS — HR 73 | Ht 65.0 in | Wt 204.0 lb

## 2022-10-29 DIAGNOSIS — M9905 Segmental and somatic dysfunction of pelvic region: Secondary | ICD-10-CM | POA: Diagnosis not present

## 2022-10-29 DIAGNOSIS — R0781 Pleurodynia: Secondary | ICD-10-CM

## 2022-10-29 DIAGNOSIS — M9902 Segmental and somatic dysfunction of thoracic region: Secondary | ICD-10-CM | POA: Diagnosis not present

## 2022-10-29 DIAGNOSIS — M546 Pain in thoracic spine: Secondary | ICD-10-CM

## 2022-10-29 DIAGNOSIS — G8929 Other chronic pain: Secondary | ICD-10-CM

## 2022-10-29 DIAGNOSIS — M9903 Segmental and somatic dysfunction of lumbar region: Secondary | ICD-10-CM

## 2022-10-29 DIAGNOSIS — M9904 Segmental and somatic dysfunction of sacral region: Secondary | ICD-10-CM

## 2022-10-29 DIAGNOSIS — M9908 Segmental and somatic dysfunction of rib cage: Secondary | ICD-10-CM

## 2022-10-29 NOTE — Patient Instructions (Addendum)
Good to see you  HEP  2-4 week msk follow up

## 2022-11-02 ENCOUNTER — Ambulatory Visit: Payer: BC Managed Care – PPO | Admitting: Sports Medicine

## 2022-11-02 ENCOUNTER — Encounter: Payer: Self-pay | Admitting: Sports Medicine

## 2022-11-02 DIAGNOSIS — M25551 Pain in right hip: Secondary | ICD-10-CM | POA: Diagnosis not present

## 2022-11-02 DIAGNOSIS — M533 Sacrococcygeal disorders, not elsewhere classified: Secondary | ICD-10-CM

## 2022-11-02 DIAGNOSIS — M9908 Segmental and somatic dysfunction of rib cage: Secondary | ICD-10-CM | POA: Diagnosis not present

## 2022-11-02 MED ORDER — CELECOXIB 100 MG PO CAPS: 200.0000 mg | ORAL_CAPSULE | Freq: Every day | ORAL | 1 refills | Status: DC

## 2022-11-02 NOTE — Progress Notes (Signed)
Desiree Rivera - 36 y.o. female MRN [id number]  Date of birth: [birth year]  Office Visit Note: Visit Date: 11/02/2022 PCP: Malva LimesFisher, Donald E, MD Referred by: Malva LimesFisher, Donald E, MD  Subjective: Chief Complaint  Patient presents with   Right Leg - Follow-up   HPI: Desiree Rivera is a pleasant 36 y.o. female who presents today for follow-up of lateral hip and GT pain. Also f/u on rib pain.  Right lateral hip pain -patient scheduled patient is doing much better.  Since beginning of her shockwave treatments home therapy, she reports being 90% improved from her pain.  He is with her home rehab exercises.  It is somewhat painful to lay on the side when doing strengthening for the opposite hip at times.  Right SI-joint pain -this continues and is somewhat bothersome for her.  Her lateral hip is feeling better, she feels like the majority of the pain localizes over the SI joint.  We have discussed injection in the past, she feels she is ready to move forward with this.  He continues on Celebrex 100 mg once-twice daily.  Recently saw Dr. Aleen SellsBen Jackson on 10/29/22 for OMT for the rib and other somatic dysfunctions.  Pertinent ROS were reviewed with the patient and found to be negative unless otherwise specified above in HPI.   Assessment & Plan: Visit Diagnoses:  1. Greater trochanteric pain syndrome of right lower extremity   2. Pain of right hip   3. Sacroiliac joint dysfunction of right side   4. Somatic dysfunction of rib cage region    Plan: Discussed with Desiree Rivera the etiology of her hip pain.  We both are very pleased she has had great success with shockwave treatment and home therapy for the lateral hip.  We did perform 1 repeat session today.  Will advance her to a home exercise program only.  She has been finding benefit from Celebrex, although forgets to take it on occasion.  She will continue this, but we will change to 200 mg taken once daily as needed. She may discontinue the  muscle relaxers. We will proceed with ultrasound-guided SI joint injection on the right for both diagnostic and hopefully therapeutic purposes.  Risk/benefits/indications discussed today.  We do know that she has a history of a labral tear of the right hip, although upon provocative testing this is not very bothersome for her.  Follow-up: Return for f/u with Dr. Shon BatonBrooks for US-guided SI-jt inj .   Meds & Orders:  Meds ordered this encounter  Medications   celecoxib (CELEBREX) 100 MG capsule    Sig: Take 2 capsules (200 mg total) by mouth daily.    Dispense:  60 capsule    Refill:  1   No orders of the defined types were placed in this encounter.    Procedures: Procedure: ECSWT Indications:  Greater trochanteric pain    Procedure Details Consent: Risks of procedure as well as the alternatives and risks of each were explained to the patient.  Verbal consent for procedure obtained. Time Out: Verified patient identification, verified procedure, site was marked, verified correct patient position. The area was cleaned with alcohol swab.     The right greater trochanteric region and gluteus medius/minimus tendons was targeted for Extracorporeal shockwave therapy.    Preset: Trochanteric bursitis Power Level: 120 MJ  Frequency: 12 Hz Impulse/cycles: 3000 Head size: Regular   Patient tolerated procedure well without immediate complications.      Clinical History: No specialty comments available.  She reports that she quit smoking about 14 years ago. She has never used smokeless tobacco.  Recent Labs    04/08/22 1450  HGBA1C 5.1    Objective:    Physical Exam  Gen: Well-appearing, in no acute distress; non-toxic CV: Regular Rate. Well-perfused. Warm.  Resp: Breathing unlabored on room air; no wheezing. Psych: Fluid speech in conversation; appropriate affect; normal thought process Neuro: Sensation intact throughout. No gross coordination deficits.   Ortho Exam - Right hip:  There is TTP noted over the greater trochanter.  No redness or effusion.  There is no restriction with active and passive internal and external logroll testing.  Very mild pain with FABER testing, negative FADIR testing.  She has very mild weakness with resisted hip abduction compared to the contralateral side, although certainly improved from prior visits.  Otherwise 5/5 strength in all directions. Negative Finnoff test and Stride test.  - Low back/SI joint: No midline spinous process TTP.  There is positive TTP over the right SI joint. + Fortin's point test. + Gaenslen's test, + SI joint compression test.  Full range of motion of the lumbar spine in flexion and extension.  Imaging:  MR Hip Right w/ contrast CLINICAL DATA:  Hip pain, chronic, labral tear suspected, xray done  EXAM: MRI OF THE RIGHT HIP WITH CONTRAST (MR Arthrogram)  TECHNIQUE: Multiplanar, multisequence MR imaging of the hip was performed immediately following contrast injection into the hip joint under fluoroscopic guidance. No intravenous contrast was administered.  COMPARISON:  Radiograph 10/15/2020  FINDINGS: Bones: There is no evidence of acute fracture, dislocation or avascular necrosis. No focal bone lesion. The visualized sacroiliac joints and symphysis pubis appear normal.  Articular cartilage and labrum  Articular cartilage: No focal chondral defect or subchondral signal abnormality identified.  Labrum: There is contrast undercutting of the anterior labrum with linear signal extending into the labral substance from proximally 2 o'clock-5 o'clock (axial oblique images 14-18). There is also contrast undercutting of the posterosuperior labrum, which extends approximately 50% the width at the chondrolabral junction, is smooth, and without definite adjacent chondral abnormality (coronal T1 image 10-14). No paralabral cyst formation.  Joint or bursal effusion  Joint effusion: Adequate distention of the hip  joint by arthrogram technique.  Bursae: There is very mild edema signal at the gluteus minimus attachment on the greater trochanter lateral aspect, likely mild bursitis.  Muscles and tendons  Muscles and tendons: The visualized gluteus, hamstring and iliopsoas tendons appear normal. No muscle edema or atrophy.  Other findings  Miscellaneous: The visualized internal pelvic contents appear unremarkable.  IMPRESSION: Nondisplaced anterior labral tear from 2 o'clock-5 o'clock. Additional undercutting of the posterosuperior labrum which could represent a sublabral sulcus versus small undersurface tear at the chondrolabral junction.  Mild edema signal at the gluteus minimus attachment on the greater trochanter lateral aspect, likely mild bursitis.  Electronically Signed   By: Maurine Simmering   On: 04/16/2021 10:52    Past Medical/Family/Surgical/Social History: Medications & Allergies reviewed per EMR, new medications updated. Patient Active Problem List   Diagnosis Date Noted   Personal history of other diseases of the female genital tract 11/25/2016   History of other malignant neoplasm of skin 11/25/2016   Allergic rhinitis 11/25/2016   Cephalalgia 04/20/2006   Past Medical History:  Diagnosis Date   Abnormal Pap smear of cervix    CIN II (cervical intraepithelial neoplasia II)    Migraine    Family History  Problem Relation Age of Onset  Diabetes Mellitus II Maternal Grandmother    Thyroid cancer Maternal Aunt    Lung cancer Maternal Grandfather    Past Surgical History:  Procedure Laterality Date   LEEP     TONSILLECTOMY     WISDOM TOOTH EXTRACTION     Social History   Occupational History   Not on file  Tobacco Use   Smoking status: Former    Types: Cigarettes    Quit date: 11/24/2007    Years since quitting: 14.9   Smokeless tobacco: Never  Vaping Use   Vaping Use: Never used  Substance and Sexual Activity   Alcohol use: Not on file   Drug use: No    Sexual activity: Yes    Partners: Male    Birth control/protection: Pill

## 2022-11-02 NOTE — Progress Notes (Signed)
Doing better; Treatments helping Wants 1 more treatment  But ultimately wants SI joint injection

## 2022-11-09 ENCOUNTER — Ambulatory Visit: Payer: BC Managed Care – PPO | Admitting: Sports Medicine

## 2022-11-09 ENCOUNTER — Encounter: Payer: Self-pay | Admitting: Sports Medicine

## 2022-11-09 ENCOUNTER — Ambulatory Visit: Payer: Self-pay

## 2022-11-09 DIAGNOSIS — M533 Sacrococcygeal disorders, not elsewhere classified: Secondary | ICD-10-CM

## 2022-11-09 DIAGNOSIS — M25551 Pain in right hip: Secondary | ICD-10-CM

## 2022-11-09 NOTE — Progress Notes (Signed)
   Procedure Note  Patient: Desiree Rivera             Date of Birth: 09-18-1986           MRN: [id number]             Visit Date: 11/09/2022  Procedures: Visit Diagnoses:  1. Sacroiliac joint dysfunction of right side   2. Pain of right hip    U/S-guided SI-joint injection, right   After discussion of risk/benefits/indications, informed verbal consent was obtained. A timeout was then performed. The patient was positioned in a prone position on exam room table with a pillow placed under the pelvis for mild hip flexion. The SI joint area was cleaned and prepped with betadine and alcohol swabs. Sterile ultrasound gel was applied and the ultrasound transducer was placed in an anatomic axial plane over the PSIS, then moved distally over the SI-joint. Using ultrasound guidance, a 22-gauge, 3.5" needle was inserted from a medial to lateral approach utilizing an in-plane approach and directed into the SI-joint. The SI-joint was then injected with a mixture of 4:2 lidocaine:depomedrol with visualization of the injectate flow into the SI-joint under ultrasound visualization. The patient tolerated the procedure well without immediate complications.  - I evaluated the patient about 10 minutes post-injection and she did have good improvement in pain   - I would like Sam to see over the next 2 weeks how this injection helped and where her pain is located (SI-jt, lateral hip, inner hip/groin) and let me know so we may discuss next steps.  If the pain is more on the lateral hip, we could always consider a greater trochanter injection.  If her pain remains diffuse, we may want to proceed with MRI of the pelvis. The pain is located deep within the groin/hip with clicking, and may be wise to have her see a hip preservation specialist to talk about labral repair options  Madelyn Brunner, DO Primary Care Sports Medicine Physician  [facility]  This note was dictated using Dragon naturally  speaking software and may contain errors in syntax, spelling, or content which have not been identified prior to signing this note.

## 2022-11-12 ENCOUNTER — Ambulatory Visit: Payer: BC Managed Care – PPO | Admitting: Sports Medicine

## 2022-11-20 NOTE — Progress Notes (Deleted)
   Desiree Rivera D.[name] [address] [place] [address] Phone: [phone]   Assessment and Plan:     There are no diagnoses linked to this encounter.  *** - Patient has received significant relief with OMT in the past.  Elects for repeat OMT today.  Tolerated well per note below. - Decision today to treat with OMT was based on Physical Exam   After verbal consent patient was treated with HVLA (high velocity low amplitude), ME (muscle energy), FPR (flex positional release), ST (soft tissue), PC/PD (Pelvic Compression/ Pelvic Decompression) techniques in cervical, rib, thoracic, lumbar, and pelvic areas. Patient tolerated the procedure well with improvement in symptoms.  Patient educated on potential side effects of soreness and recommended to rest, hydrate, and use Tylenol as needed for pain control.   Pertinent previous records reviewed include ***   Follow Up: ***     Subjective:   I, Desiree Rivera, am serving as a Neurosurgeon for Doctor Richardean Sale   Chief Complaint: rib pain    HPI:    10/29/2022 Patient is a 36 year old female complaining of rib pain. Patient states that it has been going on for 3 years pain , hx of torn labrum, former runner , pain from boob to knee, TTP ribcage around to the thoracic, floating rib pain that was adjusted by chiro didn't see a difference in pain , will get intermittent sharp pain through the rib cage makes her nausea at times, ib and tylenol has not helped , sometimes numbness and tingling when she moves a certain way    11/24/2022 Patient states    Relevant Historical Information: Right hip labral tear  Additional pertinent review of systems negative.  Current Outpatient Medications  Medication Sig Dispense Refill   baclofen (LIORESAL) 10 MG tablet Take 0.5-1 tablets (5-10 mg total) by mouth at bedtime as needed for muscle spasms. 30 each 3   celecoxib (CELEBREX) 100 MG capsule Take 2 capsules (200 mg  total) by mouth daily. 60 capsule 1   methocarbamol (ROBAXIN) 500 MG tablet TAKE 1 TABLET BY MOUTH 4 TIMES DAILY. 30 tablet 4   MULTIPLE VITAMIN PO Take by mouth.     Norgestimate-Ethinyl Estradiol Triphasic (TRI-SPRINTEC) 0.18/0.215/0.25 MG-35 MCG tablet Take 1 tablet by mouth daily. 84 tablet 3   No current facility-administered medications for this visit.      Objective:     There were no vitals filed for this visit.    There is no height or weight on file to calculate BMI.    Physical Exam:     General: Well-appearing, cooperative, sitting comfortably in no acute distress.   OMT Physical Exam:  ASIS Compression Test: Positive Right Cervical: TTP paraspinal, *** Rib: Bilateral elevated first rib with TTP Thoracic: TTP paraspinal,*** Lumbar: TTP paraspinal,*** Pelvis: Right anterior innominate  Electronically signed by:  Desiree Rivera D.[name] 8:30 AM 11/20/22

## 2022-11-24 ENCOUNTER — Ambulatory Visit: Payer: BC Managed Care – PPO | Admitting: Sports Medicine

## 2022-11-24 NOTE — Progress Notes (Unsigned)
   Desiree Rivera D.[place] Phone: [phone]   Assessment and Plan:     There are no diagnoses linked to this encounter.  *** - Patient has received significant relief with OMT in the past.  Elects for repeat OMT today.  Tolerated well per note below. - Decision today to treat with OMT was based on Physical Exam   After verbal consent patient was treated with HVLA (high velocity low amplitude), ME (muscle energy), FPR (flex positional release), ST (soft tissue), PC/PD (Pelvic Compression/ Pelvic Decompression) techniques in cervical, rib, thoracic, lumbar, and pelvic areas. Patient tolerated the procedure well with improvement in symptoms.  Patient educated on potential side effects of soreness and recommended to rest, hydrate, and use Tylenol as needed for pain control.   Pertinent previous records reviewed include ***   Follow Up: ***     Subjective:   I, Desiree Rivera, am serving as a Education administrator for Doctor Glennon Mac   Chief Complaint: rib pain    HPI:    10/29/2022 Patient is a 36 year old female complaining of rib pain. Patient states that it has been going on for 3 years pain , hx of torn labrum, former runner , pain from boob to knee, TTP ribcage around to the thoracic, floating rib pain that was adjusted by chiro didn't see a difference in pain , will get intermittent sharp pain through the rib cage makes her nausea at times, ib and tylenol has not helped , sometimes numbness and tingling when she moves a certain way    12/28/3662  Patient states   Relevant Historical Information: Right hip labral tear    Additional pertinent review of systems negative.  Current Outpatient Medications  Medication Sig Dispense Refill   baclofen (LIORESAL) 10 MG tablet Take 0.5-1 tablets (5-10 mg total) by mouth at bedtime as needed for muscle spasms. 30 each 3   celecoxib (CELEBREX) 100 MG capsule Take 2 capsules (200 mg  total) by mouth daily. 60 capsule 1   methocarbamol (ROBAXIN) 500 MG tablet TAKE 1 TABLET BY MOUTH 4 TIMES DAILY. 30 tablet 4   MULTIPLE VITAMIN PO Take by mouth.     Norgestimate-Ethinyl Estradiol Triphasic (TRI-SPRINTEC) 0.18/0.215/0.25 MG-35 MCG tablet Take 1 tablet by mouth daily. 84 tablet 3   No current facility-administered medications for this visit.      Objective:     There were no vitals filed for this visit.    There is no height or weight on file to calculate BMI.    Physical Exam:     General: Well-appearing, cooperative, sitting comfortably in no acute distress.   OMT Physical Exam:  ASIS Compression Test: Positive Right Cervical: TTP paraspinal, *** Rib: Bilateral elevated first rib with TTP Thoracic: TTP paraspinal,*** Lumbar: TTP paraspinal,*** Pelvis: Right anterior innominate  Electronically signed by:  Desiree Rivera D.[facility] 1:31 PM 11/24/22

## 2022-11-25 ENCOUNTER — Ambulatory Visit: Payer: BC Managed Care – PPO | Admitting: Sports Medicine

## 2022-11-25 VITALS — BP 132/80 | HR 74 | Ht 65.0 in | Wt 200.0 lb

## 2022-11-25 DIAGNOSIS — M9905 Segmental and somatic dysfunction of pelvic region: Secondary | ICD-10-CM | POA: Diagnosis not present

## 2022-11-25 DIAGNOSIS — M9904 Segmental and somatic dysfunction of sacral region: Secondary | ICD-10-CM

## 2022-11-25 DIAGNOSIS — M533 Sacrococcygeal disorders, not elsewhere classified: Secondary | ICD-10-CM

## 2022-11-25 DIAGNOSIS — M9901 Segmental and somatic dysfunction of cervical region: Secondary | ICD-10-CM

## 2022-11-25 DIAGNOSIS — M546 Pain in thoracic spine: Secondary | ICD-10-CM | POA: Diagnosis not present

## 2022-11-25 DIAGNOSIS — M9902 Segmental and somatic dysfunction of thoracic region: Secondary | ICD-10-CM

## 2022-11-25 DIAGNOSIS — G8929 Other chronic pain: Secondary | ICD-10-CM

## 2022-11-25 DIAGNOSIS — M9903 Segmental and somatic dysfunction of lumbar region: Secondary | ICD-10-CM

## 2022-11-25 DIAGNOSIS — M542 Cervicalgia: Secondary | ICD-10-CM | POA: Diagnosis not present

## 2022-12-08 NOTE — Progress Notes (Signed)
Desiree Rivera D.Desiree Rivera Phone: [phone]   Assessment and Plan:     1. Chronic bilateral thoracic back pain 2. Neck pain 3. Chronic right SI joint pain 4. Somatic dysfunction of cervical region 5. Somatic dysfunction of thoracic region 6. Somatic dysfunction of lumbar region 7. Somatic dysfunction of pelvic region 8. Somatic dysfunction of sacral region  -Chronic with exacerbation, subsequent visit - Overall improvement with regular OMT visits as well as general improvement after right SI joint CSI, though patient continues to have more gradual right groin and hip pain as well as continued chronic low back, upper back, neck pain - Patient is planning on scheduling a follow-up visit with Dr. Rolena Infante next week to evaluate benefit of SI CSI and discuss other potential injections versus treatment options - Patient has received significant relief with OMT in the past.  Elects for repeat OMT today.  Tolerated well per note below. - Decision today to treat with OMT was based on Physical Exam   After verbal consent patient was treated with HVLA (high velocity low amplitude), ME (muscle energy), FPR (flex positional release), ST (soft tissue), PC/PD (Pelvic Compression/ Pelvic Decompression) techniques in cervical, sacral, thoracic, lumbar, and pelvic areas. Patient tolerated the procedure well with improvement in symptoms.  Patient educated on potential side effects of soreness and recommended to rest, hydrate, and use Tylenol as needed for pain control.   Pertinent previous records reviewed include none   Follow Up: 2 to 4 weeks for reevaluation.  Could consider repeat OMT   Subjective:   I , Desiree Rivera, am serving as a Education administrator for Doctor Glennon Mac   Chief Complaint: rib pain    HPI:    10/29/2022 Patient is a 37 year old female complaining of rib pain. Patient states that it has been going on for 3 years pain , hx  of torn labrum, former runner , pain from boob to knee, TTP ribcage around to the thoracic, floating rib pain that was adjusted by chiro didn't see a difference in pain , will get intermittent sharp pain through the rib cage makes her nausea at times, ib and tylenol has not helped , sometimes numbness and tingling when she moves a certain way    5/0/2774  Patient states ready for a tune up   12/09/2022 Patients states good , overall pain has gone down but still has pain when she wakes in the am and a little of pain in si joint but especially around the right rib cage , ribs TTP under her breast feels very "pressury and sore", top of hip is painful     Relevant Historical Information: Right hip labral tear Additional pertinent review of systems negative.  Current Outpatient Medications  Medication Sig Dispense Refill   baclofen (LIORESAL) 10 MG tablet Take 0.5-1 tablets (5-10 mg total) by mouth at bedtime as needed for muscle spasms. 30 each 3   celecoxib (CELEBREX) 100 MG capsule Take 2 capsules (200 mg total) by mouth daily. 60 capsule 1   methocarbamol (ROBAXIN) 500 MG tablet TAKE 1 TABLET BY MOUTH 4 TIMES DAILY. 30 tablet 4   MULTIPLE VITAMIN PO Take by mouth.     Norgestimate-Ethinyl Estradiol Triphasic (TRI-SPRINTEC) 0.18/0.215/0.25 MG-35 MCG tablet Take 1 tablet by mouth daily. 84 tablet 3   No current facility-administered medications for this visit.      Objective:     Vitals:   12/09/22 1545  BP: 118/80  Pulse: 60  SpO2: 100%  Weight: 200 lb (90.7 kg)  Height: 5\' 5"  (1.651 m)      Body mass index is 33.28 kg/m.    Physical Exam:     General: Well-appearing, cooperative, sitting comfortably in no acute distress.   OMT Physical Exam:   ASIS Compression Test: Positive Right Cervical: TTP paraspinal, C3-6 RRSR Sacrum: Positive sphinx, TTP bilateral sacral base, worse on right Thoracic: TTP paraspinal, T5 RRSR Lumbar: TTP paraspinal, L2 RR SR Pelvis: Right anterior  innominate   Electronically signed by:  Desiree Rivera D.[facility] 4:12 PM 12/09/22

## 2022-12-09 ENCOUNTER — Ambulatory Visit: Payer: BC Managed Care – PPO | Admitting: Sports Medicine

## 2022-12-09 VITALS — BP 118/80 | HR 60 | Ht 65.0 in | Wt 200.0 lb

## 2022-12-09 DIAGNOSIS — M9903 Segmental and somatic dysfunction of lumbar region: Secondary | ICD-10-CM | POA: Diagnosis not present

## 2022-12-09 DIAGNOSIS — M542 Cervicalgia: Secondary | ICD-10-CM

## 2022-12-09 DIAGNOSIS — M546 Pain in thoracic spine: Secondary | ICD-10-CM | POA: Diagnosis not present

## 2022-12-09 DIAGNOSIS — M9901 Segmental and somatic dysfunction of cervical region: Secondary | ICD-10-CM

## 2022-12-09 DIAGNOSIS — M9902 Segmental and somatic dysfunction of thoracic region: Secondary | ICD-10-CM

## 2022-12-09 DIAGNOSIS — M533 Sacrococcygeal disorders, not elsewhere classified: Secondary | ICD-10-CM

## 2022-12-09 DIAGNOSIS — M9904 Segmental and somatic dysfunction of sacral region: Secondary | ICD-10-CM

## 2022-12-09 DIAGNOSIS — G8929 Other chronic pain: Secondary | ICD-10-CM

## 2022-12-09 DIAGNOSIS — M9905 Segmental and somatic dysfunction of pelvic region: Secondary | ICD-10-CM

## 2022-12-09 NOTE — Patient Instructions (Addendum)
Good to see you  2-4 week MS[facility] follow up

## 2022-12-17 ENCOUNTER — Ambulatory Visit: Payer: BC Managed Care – PPO | Admitting: Sports Medicine

## 2022-12-17 DIAGNOSIS — M25551 Pain in right hip: Secondary | ICD-10-CM

## 2022-12-17 DIAGNOSIS — M533 Sacrococcygeal disorders, not elsewhere classified: Secondary | ICD-10-CM

## 2022-12-17 NOTE — Progress Notes (Signed)
Desiree Rivera - 37 y.o. female MRN [id number]  Date of birth: Mar 28, 1986  Office Visit Note: Visit Date: 12/17/2022 PCP: Birdie Sons, MD Referred by: Birdie Sons, MD  Subjective: Chief Complaint  Patient presents with   Left Hip - Pain   HPI: Desiree Rivera is a pleasant 37 y.o. female who presents today for follow-up of lateral hip and SI-joint pain.  At last visit, I did perform a right SI joint injection, she states this helped her a considerable amount.  She still does have some general pain in that location, but more of her pain now is localized over the lateral hip.  She gets pain that is worse in the mornings and after sitting for prolonged periods of time.  She has continued with OMT with Dr. Benito Mccreedy and does state that she gets excellent relief for a few days after treatment, but has found a longitudinal benefit as well.  Pertinent ROS were reviewed with the patient and found to be negative unless otherwise specified above in HPI.   Assessment & Plan: Visit Diagnoses:  1. Sacroiliac joint dysfunction of right side   2. Greater trochanteric pain syndrome of right lower extremity    Plan: Discussed with Desiree Rivera that I think most of her pain is emanating from the greater trochanteric region and lateral hip abductors.  She has gotten relief from the shockwave therapy, but she is more acutely tender today and does have weakness with hip abduction, likely secondary to her pain.  She will return at her convenience to proceed with ultrasound-guided greater trochanteric injection.  Following this, she will get back into her hip rehab and strengthening protocol at home aggressively.  I would like to see what sort of therapeutic response she gets for the whole hip after this injection.  May continue Celebrex as previously prescribed.  Follow-up: Return for early next week for US-guided GT inj.   Meds & Orders: No orders of the defined types were placed in this  encounter.  No orders of the defined types were placed in this encounter.    Procedures: No procedures performed      Clinical History: No specialty comments available.  She reports that she quit smoking about 15 years ago. She has never used smokeless tobacco.  Recent Labs    04/08/22 1450  HGBA1C 5.1    Objective:    Physical Exam  Gen: Well-appearing, in no acute distress; non-toxic CV: Regular Rate. Well-perfused. Warm.  Resp: Breathing unlabored on room air; no wheezing. Psych: Fluid speech in conversation; appropriate affect; normal thought process Neuro: Sensation intact throughout. No gross coordination deficits.   Ortho Exam - Right hip: Positive TTP noted over the greater trochanter without significant redness or swelling.  There is no restriction with active and passive internal and external logroll.  Pain with FABER testing, although improved.  There is pain as well as weakness with resisted hip abduction compared to the contralateral side, otherwise 5/5 strength testing.  Much improved TTP over the right SI joint, negative Gaenslen's test today.  Imaging: No results found.  Past Medical/Family/Surgical/Social History: Medications & Allergies reviewed per EMR, new medications updated. Patient Active Problem List   Diagnosis Date Noted   Personal history of other diseases of the female genital tract 11/25/2016   History of other malignant neoplasm of skin 11/25/2016   Allergic rhinitis 11/25/2016   Cephalalgia 04/20/2006   Past Medical History:  Diagnosis Date   Abnormal Pap smear of  cervix    CIN II (cervical intraepithelial neoplasia II)    Migraine    Family History  Problem Relation Age of Onset   Diabetes Mellitus II Maternal Grandmother    Thyroid cancer Maternal Aunt    Lung cancer Maternal Grandfather    Past Surgical History:  Procedure Laterality Date   LEEP     TONSILLECTOMY     WISDOM TOOTH EXTRACTION     Social History    Occupational History   Not on file  Tobacco Use   Smoking status: Former    Types: Cigarettes    Quit date: 11/24/2007    Years since quitting: 15.0   Smokeless tobacco: Never  Vaping Use   Vaping Use: Never used  Substance and Sexual Activity   Alcohol use: Not on file   Drug use: No   Sexual activity: Yes    Partners: Male    Birth control/protection: Pill

## 2022-12-17 NOTE — Progress Notes (Signed)
90% better States injection helped Still has some pain that shockwave was treating, but feels like the SI joint is better  States she notices the pain mostly in mornings, and when sitting on the couch for prolonged periods of time.

## 2022-12-23 ENCOUNTER — Ambulatory Visit: Payer: BC Managed Care – PPO | Admitting: Sports Medicine

## 2022-12-23 NOTE — Progress Notes (Unsigned)
   Benito Mccreedy D.[place] Phone: [phone]   Assessment and Plan:     There are no diagnoses linked to this encounter.  *** - Patient has received significant relief with OMT in the past.  Elects for repeat OMT today.  Tolerated well per note below. - Decision today to treat with OMT was based on Physical Exam   After verbal consent patient was treated with HVLA (high velocity low amplitude), ME (muscle energy), FPR (flex positional release), ST (soft tissue), PC/PD (Pelvic Compression/ Pelvic Decompression) techniques in cervical, rib, thoracic, lumbar, and pelvic areas. Patient tolerated the procedure well with improvement in symptoms.  Patient educated on potential side effects of soreness and recommended to rest, hydrate, and use Tylenol as needed for pain control.   Pertinent previous records reviewed include ***   Follow Up: ***     Subjective:   I , Desiree Rivera, am serving as a Education administrator for Doctor Glennon Mac   Chief Complaint: rib pain    HPI:    10/29/2022 Patient is a 37 year old female complaining of rib pain. Patient states that it has been going on for 3 years pain , hx of torn labrum, former runner , pain from boob to knee, TTP ribcage around to the thoracic, floating rib pain that was adjusted by chiro didn't see a difference in pain , will get intermittent sharp pain through the rib cage makes her nausea at times, ib and tylenol has not helped , sometimes numbness and tingling when she moves a certain way    06/26/6961  Patient states ready for a tune up    12/09/2022 Patients states good , overall pain has gone down but still has pain when she wakes in the am and a little of pain in si joint but especially around the right rib cage , ribs TTP under her breast feels very "pressury and sore", top of hip is painful    12/24/2022 Patient states   Relevant Historical Information: Right hip labral  tear  Additional pertinent review of systems negative.  Current Outpatient Medications  Medication Sig Dispense Refill   baclofen (LIORESAL) 10 MG tablet Take 0.5-1 tablets (5-10 mg total) by mouth at bedtime as needed for muscle spasms. 30 each 3   celecoxib (CELEBREX) 100 MG capsule Take 2 capsules (200 mg total) by mouth daily. 60 capsule 1   methocarbamol (ROBAXIN) 500 MG tablet TAKE 1 TABLET BY MOUTH 4 TIMES DAILY. 30 tablet 4   MULTIPLE VITAMIN PO Take by mouth.     Norgestimate-Ethinyl Estradiol Triphasic (TRI-SPRINTEC) 0.18/0.215/0.25 MG-35 MCG tablet Take 1 tablet by mouth daily. 84 tablet 3   No current facility-administered medications for this visit.      Objective:     There were no vitals filed for this visit.    There is no height or weight on file to calculate BMI.    Physical Exam:     General: Well-appearing, cooperative, sitting comfortably in no acute distress.   OMT Physical Exam:  ASIS Compression Test: Positive Right Cervical: TTP paraspinal, *** Rib: Bilateral elevated first rib with TTP Thoracic: TTP paraspinal,*** Lumbar: TTP paraspinal,*** Pelvis: Right anterior innominate  Electronically signed by:  Benito Mccreedy D.[facility] 4:13 PM 12/23/22

## 2022-12-24 ENCOUNTER — Ambulatory Visit: Payer: BC Managed Care – PPO | Admitting: Sports Medicine

## 2022-12-24 VITALS — BP 120/80 | HR 81 | Ht 65.0 in | Wt 200.0 lb

## 2022-12-24 DIAGNOSIS — M542 Cervicalgia: Secondary | ICD-10-CM | POA: Diagnosis not present

## 2022-12-24 DIAGNOSIS — G8929 Other chronic pain: Secondary | ICD-10-CM | POA: Diagnosis not present

## 2022-12-24 DIAGNOSIS — M9903 Segmental and somatic dysfunction of lumbar region: Secondary | ICD-10-CM | POA: Diagnosis not present

## 2022-12-24 DIAGNOSIS — M9902 Segmental and somatic dysfunction of thoracic region: Secondary | ICD-10-CM

## 2022-12-24 DIAGNOSIS — M533 Sacrococcygeal disorders, not elsewhere classified: Secondary | ICD-10-CM | POA: Diagnosis not present

## 2022-12-24 DIAGNOSIS — M9905 Segmental and somatic dysfunction of pelvic region: Secondary | ICD-10-CM

## 2022-12-24 DIAGNOSIS — M9901 Segmental and somatic dysfunction of cervical region: Secondary | ICD-10-CM | POA: Diagnosis not present

## 2022-12-24 DIAGNOSIS — M9904 Segmental and somatic dysfunction of sacral region: Secondary | ICD-10-CM

## 2022-12-24 DIAGNOSIS — M546 Pain in thoracic spine: Secondary | ICD-10-CM

## 2022-12-24 MED ORDER — MELOXICAM 15 MG PO TABS: 15.0000 mg | ORAL_TABLET | Freq: Every day | ORAL | 0 refills | Status: DC

## 2022-12-24 NOTE — Patient Instructions (Addendum)
Good to see you  4 week follow up  - Start meloxicam 15 mg daily x2 weeks.  If still having pain after 2 weeks, complete 3rd-week of meloxicam. May use remaining meloxicam as needed once daily for pain control.  Do not to use additional NSAIDs while taking meloxicam.  May use Tylenol [phone] mg 2 to 3 times a day for breakthrough pain.

## 2022-12-25 ENCOUNTER — Ambulatory Visit: Payer: Self-pay

## 2022-12-25 ENCOUNTER — Encounter: Payer: Self-pay | Admitting: Sports Medicine

## 2022-12-25 ENCOUNTER — Ambulatory Visit: Payer: BC Managed Care – PPO | Admitting: Sports Medicine

## 2022-12-25 DIAGNOSIS — M25551 Pain in right hip: Secondary | ICD-10-CM | POA: Diagnosis not present

## 2022-12-25 MED ORDER — LIDOCAINE HCL 1 % IJ SOLN
2.0000 mL | INTRAMUSCULAR | Status: AC | PRN
  Administered 2022-12-25: 2 mL

## 2022-12-25 MED ORDER — BUPIVACAINE HCL 0.25 % IJ SOLN
2.0000 mL | INTRAMUSCULAR | Status: AC | PRN
  Administered 2022-12-25: 2 mL via INTRA_ARTICULAR

## 2022-12-25 MED ORDER — BETAMETHASONE SOD PHOS & ACET 6 (3-3) MG/ML IJ SUSP
6.0000 mg | INTRAMUSCULAR | Status: AC | PRN
  Administered 2022-12-25: 6 mg via INTRA_ARTICULAR

## 2022-12-25 NOTE — Progress Notes (Signed)
    Procedure Note  Patient: Desiree Rivera             Date of Birth: May 15, 1986           MRN: [id number]             Visit Date: 12/25/2022  Procedures: Visit Diagnoses:  1. Greater trochanteric pain syndrome of right lower extremity   2. Pain in right hip    Large Joint Inj: R greater trochanter on 12/25/2022 3:23 PM Indications: pain Details: 22 G 3.5 in needle, ultrasound-guided lateral approach Medications: 2 mL lidocaine 1 %; 2 mL bupivacaine 0.25 %; 6 mg betamethasone acetate-betamethasone sodium phosphate 6 (3-3) MG/ML Outcome: tolerated well, no immediate complications  US-Guided Greater Trochanteric Bursa Injection, Right After discussion on risks/benefits/indications and informed verbal consent was obtained, a timeout was performed. The patient was lying in lateral recumbent position on exam table. Using ultrasound guidance, the greater trochanter was identified. The area overlying the trochanteric bursa was then prepped with Betadine and alcohol swabs. Following sterile precautions, ultrasound was reapplied to visualize needle guidance with a 22-gauge 3.5" needle utilizing an in-plane approach to inject the bursa with 2:2:1 lidocaine:bupivicaine:betamethasone. Delivery of the injectate was visualized into the region of hypoechoic fluid of the greater trochanteric bursa. Patient tolerated procedure well without immediate complications.    Procedure, treatment alternatives, risks and benefits explained, specific risks discussed. Consent was given by the patient. Immediately prior to procedure a time out was called to verify the correct patient, procedure, equipment, support staff and site/side marked as required. Patient was prepped and draped in the usual sterile fashion.    - I evaluated the patient about 10 minutes post-injection and she had good improvement in pain and range of motion (no pain on palpation) -She will take it easy this weekend, but then starting Monday we  will get back into her hip rehab and strengthening protocol at home - follow-up with me as needed  Elba Barman, DO [facility]  This note was dictated using Dragon naturally speaking software and may contain errors in syntax, spelling, or content which have not been identified prior to signing this note.

## 2023-01-20 ENCOUNTER — Ambulatory Visit: Payer: BC Managed Care – PPO | Admitting: Sports Medicine

## 2023-01-20 VITALS — BP 108/78 | HR 59 | Ht 65.0 in | Wt 200.0 lb

## 2023-01-20 DIAGNOSIS — M9901 Segmental and somatic dysfunction of cervical region: Secondary | ICD-10-CM | POA: Diagnosis not present

## 2023-01-20 DIAGNOSIS — M533 Sacrococcygeal disorders, not elsewhere classified: Secondary | ICD-10-CM

## 2023-01-20 DIAGNOSIS — G8929 Other chronic pain: Secondary | ICD-10-CM

## 2023-01-20 DIAGNOSIS — M9905 Segmental and somatic dysfunction of pelvic region: Secondary | ICD-10-CM | POA: Diagnosis not present

## 2023-01-20 DIAGNOSIS — M546 Pain in thoracic spine: Secondary | ICD-10-CM

## 2023-01-20 DIAGNOSIS — M542 Cervicalgia: Secondary | ICD-10-CM | POA: Diagnosis not present

## 2023-01-20 DIAGNOSIS — M9903 Segmental and somatic dysfunction of lumbar region: Secondary | ICD-10-CM | POA: Diagnosis not present

## 2023-01-20 DIAGNOSIS — M9902 Segmental and somatic dysfunction of thoracic region: Secondary | ICD-10-CM | POA: Diagnosis not present

## 2023-01-20 DIAGNOSIS — M9904 Segmental and somatic dysfunction of sacral region: Secondary | ICD-10-CM

## 2023-01-20 NOTE — Patient Instructions (Signed)
Good to see you   

## 2023-01-20 NOTE — Progress Notes (Signed)
Benito Mccreedy D.[place] Phone: [phone]   Assessment and Plan:     1. Chronic bilateral thoracic back pain 2. Neck pain 3. Chronic right SI joint pain 4. Somatic dysfunction of cervical region 5. Somatic dysfunction of thoracic region 6. Somatic dysfunction of lumbar region 7. Somatic dysfunction of pelvic region 8. Somatic dysfunction of sacral region  -Chronic with exacerbation, subsequent sports medicine visit - Overall improvement in symptoms with patient continuing to follow with Dr. Rolena Infante and recent greater trochanteric CSI that has helped decrease lateral hip pain - Recurrence of multiple musculoskeletal pains including continued right SI joint pain, low back pain, neck pain - Patient did receive benefit when taking meloxicam daily for 1 week, however states that she forgot to take medication and was only taking it about every other day from that point on.  Recommend taking medication consistently and daily for 2 to 3 weeks and then discontinuing - Patient has received significant relief with OMT in the past.  Elects for repeat OMT today.  Tolerated well per note below. - Decision today to treat with OMT was based on Physical Exam   After verbal consent patient was treated with HVLA (high velocity low amplitude), ME (muscle energy), FPR (flex positional release), ST (soft tissue), PC/PD (Pelvic Compression/ Pelvic Decompression) techniques in cervical, sacrum, thoracic, lumbar, and pelvic areas. Patient tolerated the procedure well with improvement in symptoms.  Patient educated on potential side effects of soreness and recommended to rest, hydrate, and use Tylenol as needed for pain control.   Pertinent previous records reviewed include Ortho note 12/25/2022   Follow Up: 4 weeks for reevaluation.  Could consider repeat OMT   Subjective:   I , Moenique Parris, am serving as a Education administrator for Doctor Glennon Mac    Chief Complaint: rib pain    HPI:    10/29/2022 Patient is a 37 year old female complaining of rib pain. Patient states that it has been going on for 3 years pain , hx of torn labrum, former runner , pain from boob to knee, TTP ribcage around to the thoracic, floating rib pain that was adjusted by chiro didn't see a difference in pain , will get intermittent sharp pain through the rib cage makes her nausea at times, ib and tylenol has not helped , sometimes numbness and tingling when she moves a certain way    [id number]  Patient states ready for a tune up    12/09/2022 Patients states good , overall pain has gone down but still has pain when she wakes in the am and a little of pain in si joint but especially around the right rib cage , ribs TTP under her breast feels very "pressury and sore", top of hip is painful    12/24/2022 Patient states that she is good , right rib cage and outer hip are flared   01/20/2023 Patient states that she is good , she fell off on the meloxicam she forgot some days, overall she does feel good, has a lingering dull soreness in the SI join to the bottom rib cage , but outer hip feels good though    Relevant Historical Information: Right hip labral tear  Additional pertinent review of systems negative.  Current Outpatient Medications  Medication Sig Dispense Refill   baclofen (LIORESAL) 10 MG tablet Take 0.5-1 tablets (5-10 mg total) by mouth at bedtime as needed for muscle spasms. 30 each 3  celecoxib (CELEBREX) 100 MG capsule Take 2 capsules (200 mg total) by mouth daily. 60 capsule 1   meloxicam (MOBIC) 15 MG tablet Take 1 tablet (15 mg total) by mouth daily. 30 tablet 0   methocarbamol (ROBAXIN) 500 MG tablet TAKE 1 TABLET BY MOUTH 4 TIMES DAILY. 30 tablet 4   MULTIPLE VITAMIN PO Take by mouth.     Norgestimate-Ethinyl Estradiol Triphasic (TRI-SPRINTEC) 0.18/0.215/0.25 MG-35 MCG tablet Take 1 tablet by mouth daily. 84 tablet 3   No current  facility-administered medications for this visit.      Objective:     Vitals:   01/20/23 1521  BP: 108/78  Pulse: (!) 59  SpO2: 100%  Weight: 200 lb (90.7 kg)  Height: '5\' 5"'$  (1.651 m)      Body mass index is 33.28 kg/m.    Physical Exam:     General: Well-appearing, cooperative, sitting comfortably in no acute distress.   OMT Physical Exam:  ASIS Compression Test: Positive Right Cervical: TTP paraspinal, C4 RL SR, C6 RRSL Sacrum: Positive sphinx, TTP right sacral base Thoracic: TTP paraspinal, T4 RRSR, T7 RRSR Lumbar: TTP paraspinal, L1-3 RRSL Pelvis: Right anterior innominate with out flare  Electronically signed by:  Benito Mccreedy D.[facility] 4:08 PM 01/20/23

## 2023-01-28 ENCOUNTER — Other Ambulatory Visit: Payer: Self-pay | Admitting: Sports Medicine

## 2023-02-17 ENCOUNTER — Ambulatory Visit: Payer: BC Managed Care – PPO | Admitting: Sports Medicine

## 2023-02-24 NOTE — Progress Notes (Unsigned)
Desiree Rivera D.[place] Phone: [phone]   Assessment and Plan:     There are no diagnoses linked to this encounter.  *** - Patient has received significant relief with OMT in the past.  Elects for repeat OMT today.  Tolerated well per note below. - Decision today to treat with OMT was based on Physical Exam   After verbal consent patient was treated with HVLA (high velocity low amplitude), ME (muscle energy), FPR (flex positional release), ST (soft tissue), PC/PD (Pelvic Compression/ Pelvic Decompression) techniques in cervical, rib, thoracic, lumbar, and pelvic areas. Patient tolerated the procedure well with improvement in symptoms.  Patient educated on potential side effects of soreness and recommended to rest, hydrate, and use Tylenol as needed for pain control.   Pertinent previous records reviewed include ***   Follow Up: ***     Subjective:   I , Desiree Rivera, am serving as a Education administrator for Doctor Glennon Mac   Chief Complaint: rib pain    HPI:    10/29/2022 Patient is a 37 year old female complaining of rib pain. Patient states that it has been going on for 3 years pain , hx of torn labrum, former runner , pain from boob to knee, TTP ribcage around to the thoracic, floating rib pain that was adjusted by chiro didn't see a difference in pain , will get intermittent sharp pain through the rib cage makes her nausea at times, ib and tylenol has not helped , sometimes numbness and tingling when she moves a certain way    [id number]  Patient states ready for a tune up    12/09/2022 Patients states good , overall pain has gone down but still has pain when she wakes in the am and a little of pain in si joint but especially around the right rib cage , ribs TTP under her breast feels very "pressury and sore", top of hip is painful    12/24/2022 Patient states that she is good , right rib cage and outer hip are flared     01/20/2023 Patient states that she is good , she fell off on the meloxicam she forgot some days, overall she does feel good, has a lingering dull soreness in the SI join to the bottom rib cage , but outer hip feels good though   02/25/2023 Patient states    Relevant Historical Information: Right hip labral tear  Additional pertinent review of systems negative.  Current Outpatient Medications  Medication Sig Dispense Refill   baclofen (LIORESAL) 10 MG tablet Take 0.5-1 tablets (5-10 mg total) by mouth at bedtime as needed for muscle spasms. 30 each 3   celecoxib (CELEBREX) 100 MG capsule Take 2 capsules (200 mg total) by mouth daily. 60 capsule 1   meloxicam (MOBIC) 15 MG tablet Take 1 tablet (15 mg total) by mouth daily. 30 tablet 0   methocarbamol (ROBAXIN) 500 MG tablet TAKE 1 TABLET BY MOUTH 4 TIMES DAILY. 30 tablet 4   MULTIPLE VITAMIN PO Take by mouth.     Norgestimate-Ethinyl Estradiol Triphasic (TRI-SPRINTEC) 0.18/0.215/0.25 MG-35 MCG tablet Take 1 tablet by mouth daily. 84 tablet 3   No current facility-administered medications for this visit.      Objective:     There were no vitals filed for this visit.    There is no height or weight on file to calculate BMI.    Physical Exam:     General:  Well-appearing, cooperative, sitting comfortably in no acute distress.   OMT Physical Exam:  ASIS Compression Test: Positive Right Cervical: TTP paraspinal, *** Rib: Bilateral elevated first rib with TTP Thoracic: TTP paraspinal,*** Lumbar: TTP paraspinal,*** Pelvis: Right anterior innominate  Electronically signed by:  Desiree Rivera D.[facility] 7:26 AM 02/24/23

## 2023-02-25 ENCOUNTER — Ambulatory Visit: Payer: BC Managed Care – PPO | Admitting: Sports Medicine

## 2023-02-25 VITALS — HR 62 | Ht 65.0 in | Wt 200.0 lb

## 2023-02-25 DIAGNOSIS — M546 Pain in thoracic spine: Secondary | ICD-10-CM | POA: Diagnosis not present

## 2023-02-25 DIAGNOSIS — G8929 Other chronic pain: Secondary | ICD-10-CM

## 2023-02-25 DIAGNOSIS — M9901 Segmental and somatic dysfunction of cervical region: Secondary | ICD-10-CM | POA: Diagnosis not present

## 2023-02-25 DIAGNOSIS — M533 Sacrococcygeal disorders, not elsewhere classified: Secondary | ICD-10-CM

## 2023-02-25 DIAGNOSIS — M9902 Segmental and somatic dysfunction of thoracic region: Secondary | ICD-10-CM

## 2023-02-25 DIAGNOSIS — M542 Cervicalgia: Secondary | ICD-10-CM | POA: Diagnosis not present

## 2023-02-25 DIAGNOSIS — M9905 Segmental and somatic dysfunction of pelvic region: Secondary | ICD-10-CM | POA: Diagnosis not present

## 2023-02-25 DIAGNOSIS — M9903 Segmental and somatic dysfunction of lumbar region: Secondary | ICD-10-CM

## 2023-02-25 DIAGNOSIS — M9904 Segmental and somatic dysfunction of sacral region: Secondary | ICD-10-CM

## 2023-02-25 NOTE — Patient Instructions (Addendum)
Good to see you  Pt referral  Tylenol [phone] mg 2-3 times a day for pain relief  Recommend seeing Dr. Rolena Infante for SI joint injection  3-4 week follow up

## 2023-03-18 ENCOUNTER — Ambulatory Visit: Payer: BC Managed Care – PPO

## 2023-03-23 NOTE — Progress Notes (Deleted)
[name] D.[name] [address] [place] [address] Phone: [phone]   Assessment and Plan:     There are no diagnoses linked to this encounter.  *** - Patient has received significant relief with OMT in the past.  Elects for repeat OMT today.  Tolerated well per note below. - Decision today to treat with OMT was based on Physical Exam   After verbal consent patient was treated with HVLA (high velocity low amplitude), ME (muscle energy), FPR (flex positional release), ST (soft tissue), PC/PD (Pelvic Compression/ Pelvic Decompression) techniques in cervical, rib, thoracic, lumbar, and pelvic areas. Patient tolerated the procedure well with improvement in symptoms.  Patient educated on potential side effects of soreness and recommended to rest, hydrate, and use Tylenol as needed for pain control.   Pertinent previous records reviewed include ***   Follow Up: ***     Subjective:   I , Desiree Rivera, am serving as a Neurosurgeon for Doctor Richardean Sale   Chief Complaint: rib pain    HPI:    10/29/2022 Patient is a 37 year old female complaining of rib pain. Patient states that it has been going on for 3 years pain , hx of torn labrum, former runner , pain from boob to knee, TTP ribcage around to the thoracic, floating rib pain that was adjusted by chiro didn't see a difference in pain , will get intermittent sharp pain through the rib cage makes her nausea at times, ib and tylenol has not helped , sometimes numbness and tingling when she moves a certain way    11/25/2022  Patient states ready for a tune up    12/09/2022 Patients states good , overall pain has gone down but still has pain when she wakes in the am and a little of pain in si joint but especially around the right rib cage , ribs TTP under her breast feels very "pressury and sore", top of hip is painful    12/24/2022 Patient states that she is good , right rib cage and outer hip are flared     01/20/2023 Patient states that she is good , she fell off on the meloxicam she forgot some days, overall she does feel good, has a lingering dull soreness in the SI join to the bottom rib cage , but outer hip feels good though   02/25/2023  Patient states SI joint is bothering her, neck and trap tightness on the right side with some rhomboids  03/24/2023 Patient states      Relevant Historical Information: Right hip labral tear  Additional pertinent review of systems negative.  Current Outpatient Medications  Medication Sig Dispense Refill   baclofen (LIORESAL) 10 MG tablet Take 0.5-1 tablets (5-10 mg total) by mouth at bedtime as needed for muscle spasms. 30 each 3   celecoxib (CELEBREX) 100 MG capsule Take 2 capsules (200 mg total) by mouth daily. 60 capsule 1   meloxicam (MOBIC) 15 MG tablet Take 1 tablet (15 mg total) by mouth daily. 30 tablet 0   methocarbamol (ROBAXIN) 500 MG tablet TAKE 1 TABLET BY MOUTH 4 TIMES DAILY. 30 tablet 4   MULTIPLE VITAMIN PO Take by mouth.     Norgestimate-Ethinyl Estradiol Triphasic (TRI-SPRINTEC) 0.18/0.215/0.25 MG-35 MCG tablet Take 1 tablet by mouth daily. 84 tablet 3   No current facility-administered medications for this visit.      Objective:     There were no vitals filed for this visit.  There is no height or weight on file to calculate BMI.    Physical Exam:     General: Well-appearing, cooperative, sitting comfortably in no acute distress.   OMT Physical Exam:  ASIS Compression Test: Positive Right Cervical: TTP paraspinal, *** Rib: Bilateral elevated first rib with TTP Thoracic: TTP paraspinal,*** Lumbar: TTP paraspinal,*** Pelvis: Right anterior innominate  Electronically signed by:  [name] D.[name] 7:21 AM 03/23/23

## 2023-03-24 ENCOUNTER — Ambulatory Visit: Payer: BC Managed Care – PPO | Admitting: Sports Medicine

## 2023-03-24 NOTE — Progress Notes (Unsigned)
[name] [name] [address] [place] [address] Phone: [phone]   Assessment and Plan:     There are no diagnoses linked to this encounter.  *** - Patient has received significant relief with OMT in the past.  Elects for repeat OMT today.  Tolerated well per note below. - Decision today to treat with OMT was based on Physical Exam   After verbal consent patient was treated with HVLA (high velocity low amplitude), ME (muscle energy), FPR (flex positional release), ST (soft tissue), PC/PD (Pelvic Compression/ Pelvic Decompression) techniques in cervical, rib, thoracic, lumbar, and pelvic areas. Patient tolerated the procedure well with improvement in symptoms.  Patient educated on potential side effects of soreness and recommended to rest, hydrate, and use Tylenol as needed for pain control.   Pertinent previous records reviewed include ***   Follow Up: ***     Subjective:   I , Desiree Rivera, am serving as a Neurosurgeon for Doctor Richardean Sale   Chief Complaint: rib pain    HPI:    10/29/2022 Patient is a 37 year old female complaining of rib pain. Patient states that it has been going on for 3 years pain , hx of torn labrum, former runner , pain from boob to knee, TTP ribcage around to the thoracic, floating rib pain that was adjusted by chiro didn't see a difference in pain , will get intermittent sharp pain through the rib cage makes her nausea at times, ib and tylenol has not helped , sometimes numbness and tingling when she moves a certain way    11/25/2022  Patient states ready for a tune up    12/09/2022 Patients states good , overall pain has gone down but still has pain when she wakes in the am and a little of pain in si joint but especially around the right rib cage , ribs TTP under her breast feels very "pressury and sore", top of hip is painful    12/24/2022 Patient states that she is good , right rib cage and outer hip are flared     01/20/2023 Patient states that she is good , she fell off on the meloxicam she forgot some days, overall she does feel good, has a lingering dull soreness in the SI join to the bottom rib cage , but outer hip feels good though   02/25/2023  Patient states SI joint is bothering her, neck and trap tightness on the right side with some rhomboids   03/25/2023 Patient states    Relevant Historical Information: Right hip labral tear  Additional pertinent review of systems negative.  Current Outpatient Medications  Medication Sig Dispense Refill   baclofen (LIORESAL) 10 MG tablet Take 0.5-1 tablets (5-10 mg total) by mouth at bedtime as needed for muscle spasms. 30 each 3   celecoxib (CELEBREX) 100 MG capsule Take 2 capsules (200 mg total) by mouth daily. 60 capsule 1   meloxicam (MOBIC) 15 MG tablet Take 1 tablet (15 mg total) by mouth daily. 30 tablet 0   methocarbamol (ROBAXIN) 500 MG tablet TAKE 1 TABLET BY MOUTH 4 TIMES DAILY. 30 tablet 4   MULTIPLE VITAMIN PO Take by mouth.     Norgestimate-Ethinyl Estradiol Triphasic (TRI-SPRINTEC) 0.18/0.215/0.25 MG-35 MCG tablet Take 1 tablet by mouth daily. 84 tablet 3   No current facility-administered medications for this visit.      Objective:     There were no vitals filed for this visit.  There is no height or weight on file to calculate BMI.    Physical Exam:     General: Well-appearing, cooperative, sitting comfortably in no acute distress.   OMT Physical Exam:  ASIS Compression Test: Positive Right Cervical: TTP paraspinal, *** Rib: Bilateral elevated first rib with TTP Thoracic: TTP paraspinal,*** Lumbar: TTP paraspinal,*** Pelvis: Right anterior innominate  Electronically signed by:  [name] [name] 4:14 PM 03/24/23

## 2023-03-25 ENCOUNTER — Ambulatory Visit: Payer: BC Managed Care – PPO | Admitting: Sports Medicine

## 2023-03-25 VITALS — BP 118/80 | HR 81 | Ht 65.0 in | Wt 204.0 lb

## 2023-03-25 DIAGNOSIS — M9903 Segmental and somatic dysfunction of lumbar region: Secondary | ICD-10-CM | POA: Diagnosis not present

## 2023-03-25 DIAGNOSIS — M542 Cervicalgia: Secondary | ICD-10-CM | POA: Diagnosis not present

## 2023-03-25 DIAGNOSIS — M9905 Segmental and somatic dysfunction of pelvic region: Secondary | ICD-10-CM

## 2023-03-25 DIAGNOSIS — M546 Pain in thoracic spine: Secondary | ICD-10-CM | POA: Diagnosis not present

## 2023-03-25 DIAGNOSIS — M533 Sacrococcygeal disorders, not elsewhere classified: Secondary | ICD-10-CM | POA: Diagnosis not present

## 2023-03-25 DIAGNOSIS — M9901 Segmental and somatic dysfunction of cervical region: Secondary | ICD-10-CM

## 2023-03-25 DIAGNOSIS — M9904 Segmental and somatic dysfunction of sacral region: Secondary | ICD-10-CM

## 2023-03-25 DIAGNOSIS — M9902 Segmental and somatic dysfunction of thoracic region: Secondary | ICD-10-CM | POA: Diagnosis not present

## 2023-03-25 DIAGNOSIS — G8929 Other chronic pain: Secondary | ICD-10-CM | POA: Diagnosis not present

## 2023-04-14 NOTE — Progress Notes (Deleted)
Des[facility]e Rivera [facility] [address] [place] [address] Phone: [phone]   Assessment and Plan:     There are no diagnoses linked to this encounter.  *** - Patient has received significant relief with OMT in the past.  Elects for repeat OMT today.  Tolerated well per note below. - Decision today to treat with OMT was based on Physical Exam   After verbal consent patient was treated with HVLA (high velocity low amplitude), ME (muscle energy), FPR (flex positional release), ST (soft tissue), PC/PD (Pelvic Compression/ Pelvic Decompression) techniques in cervical, rib, thoracic, lumbar, and pelvic areas. Patient tolerated the procedure well with improvement in symptoms.  Patient educated on potential side effects of soreness and recommended to rest, hydrate, and use Tylenol as needed for pain control.   Pertinent previous records reviewed include ***   Follow Up: ***     Subjective:   I , Desiree Rivera, am serving as a Neurosurgeon for Doctor Richardean Sale   Chief Complaint: rib pain    HPI:    10/29/2022 Patient is a 37 year old female complaining of rib pain. Patient states that it has been going on for 3 years pain , hx of torn labrum, former runner , pain from boob to knee, TTP ribcage around to the thoracic, floating rib pain that was adjusted by chiro didn't see a difference in pain , will get intermittent sharp pain through the rib cage makes her nausea at times, ib and tylenol has not helped , sometimes numbness and tingling when she moves a certain way    11/25/2022  Patient states ready for a tune up    12/09/2022 Patients states good , overall pain has gone down but still has pain when she wakes in the am and a little of pain in si joint but especially around the right rib cage , ribs TTP under her breast feels very "pressury and sore", top of hip is painful    12/24/2022 Patient states that she is good , right rib cage and outer hip are flared     01/20/2023 Patient states that she is good , she fell off on the meloxicam she forgot some days, overall she does feel good, has a lingering dull soreness in the SI join to the bottom rib cage , but outer hip feels good though   02/25/2023  Patient states SI joint is bothering her, neck and trap tightness on the right side with some rhomboids   03/25/2023 Patient states she is stressed,    04/15/2023 Patient states    Relevant Historical Information: Right hip labral tear Additional pertinent review of systems negative.  Current Outpatient Medications  Medication Sig Dispense Refill   baclofen (LIORESAL) 10 MG tablet Take 0.5-1 tablets (5-10 mg total) by mouth at bedtime as needed for muscle spasms. 30 each 3   celecoxib (CELEBREX) 100 MG capsule Take 2 capsules (200 mg total) by mouth daily. 60 capsule 1   meloxicam (MOBIC) 15 MG tablet Take 1 tablet (15 mg total) by mouth daily. 30 tablet 0   methocarbamol (ROBAXIN) 500 MG tablet TAKE 1 TABLET BY MOUTH 4 TIMES DAILY. 30 tablet 4   MULTIPLE VITAMIN PO Take by mouth.     Norgestimate-Ethinyl Estradiol Triphasic (TRI-SPRINTEC) 0.18/0.215/0.25 MG-35 MCG tablet Take 1 tablet by mouth daily. 84 tablet 3   No current facility-administered medications for this visit.      Objective:     There were  no vitals filed for this visit.    There is no height or weight on file to calculate BMI.    Physical Exam:     General: Well-appearing, cooperative, sitting comfortably in no acute distress.   OMT Physical Exam:  ASIS Compression Test: Positive Right Cervical: TTP paraspinal, *** Rib: Bilateral elevated first rib with TTP Thoracic: TTP paraspinal,*** Lumbar: TTP paraspinal,*** Pelvis: Right anterior innominate  Electronically signed by:  [facility]e Rivera [facility] 7:26 AM 04/14/23

## 2023-04-15 ENCOUNTER — Ambulatory Visit: Payer: BC Managed Care – PPO | Admitting: Sports Medicine

## 2023-04-21 NOTE — Progress Notes (Signed)
[name] 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  Patient has had relief with SI joint CSI in the past, but only received about 4 weeks relief.  No new trauma, so no imaging at today's visit - Patient elected for repeat CSI.  Tolerated well per note below.  Procedure: Ultrasound Guided Sacroiliac Joint Injection  Side: Right Diagnosis: Right-sided SI joint pain [place] Indication:  - accuracy is paramount for diagnosis - to ensure therapeutic efficacy or procedural success - to reduce procedural risk  After explaining the procedure, viable alternatives, risks, and answering any questions, consent was given verbally. The site was cleaned with chlorhexidine prep. An ultrasound transducer was placed on the lumbosacral spine.  The spinous processes were identified. These were followed down from the lumbar spine to the scarum and the SI joint was identified as were the neural foramen.  A needle was introduced with care taken to avoid the neural foramen under ultrasound guidance into the SI joint with sterile technique.    A steroid injection was performed using 4ml of 1% lidocaine without epinephrine and 80 mg of triamcinolone (KENALOG) 40mg /ml. This was well tolerated and resulted in  relief.  Needle was removed and dressing placed and post injection instructions were given including  a discussion of likely return of pain today after the anesthetic wears off (with the possibility of worsened pain) until the steroid starts to work in 1-3 days.   Pt was advised to call or return to clinic if these symptoms worsen or fail to improve as anticipated.    Pertinent previous records reviewed include none   Follow Up: 4 weeks for reevaluation.  Could consider  PRP SI joint injection versus repeating OMT based on symptoms   Subjective:   I , Moenique Parris, am serving as a Neurosurgeon for Doctor Richardean Sale   Chief Complaint: rib pain    HPI:    10/29/2022 Patient is a 37 year old female complaining of rib pain. Patient states that it has been going on for 3 years pain , hx of torn labrum, former runner , pain from boob to knee, TTP ribcage around to the thoracic, floating rib pain that was adjusted by chiro didn't see a difference in pain , will get intermittent sharp pain through the rib cage makes her nausea at times, ib and tylenol has not helped , sometimes numbness and tingling when she moves a certain way    11/25/2022  Patient states ready for a tune up    12/09/2022 Patients states good , overall pain has gone down but still has pain when she wakes in the am and a little of pain in si joint but especially around the right rib cage , ribs TTP under her breast feels very "pressury and sore", top of hip is painful    12/24/2022 Patient states that she is good , right rib cage and outer hip are flared    01/20/2023 Patient states that she is good , she fell off on the meloxicam she forgot some days, overall she does feel good, has a lingering dull soreness in the SI join to the bottom rib cage , but outer hip feels good though   02/25/2023  Patient states SI joint is  bothering her, neck and trap tightness on the right side with some rhomboids   03/25/2023 Patient states she is stressed,    04/28/2023 Patient states would like an injection . PT June 14th, did a stretch last night that helped with pain but pain came back when she woke up     Relevant Historical Information: Right hip labral tear  Additional pertinent review of systems negative.  Current Outpatient Medications  Medication Sig Dispense Refill   baclofen (LIORESAL) 10 MG tablet Take 0.5-1 tablets (5-10 mg total) by mouth at bedtime as needed for muscle spasms. 30 each 3    celecoxib (CELEBREX) 100 MG capsule Take 2 capsules (200 mg total) by mouth daily. 60 capsule 1   meloxicam (MOBIC) 15 MG tablet Take 1 tablet (15 mg total) by mouth daily. 30 tablet 0   methocarbamol (ROBAXIN) 500 MG tablet TAKE 1 TABLET BY MOUTH 4 TIMES DAILY. 30 tablet 4   MULTIPLE VITAMIN PO Take by mouth.     Norgestimate-Ethinyl Estradiol Triphasic (TRI-SPRINTEC) 0.18/0.215/0.25 MG-35 MCG tablet Take 1 tablet by mouth daily. 84 tablet 3   No current facility-administered medications for this visit.      Objective:     Vitals:   04/28/23 1024  BP: 120/78  Weight: 208 lb (94.3 kg)  Height: 5\' 5"  (1.651 m)      Body mass index is 34.61 kg/m.    Physical Exam:     General: Well-appearing, cooperative, sitting comfortably in no acute distress.   OMT Physical Exam:  General: awake, alert, and oriented no acute distress, nontoxic Skin: no suspicious lesions or rashes Neuro:sensation intact distally with no deficits, normal muscle tone, no atrophy, strength 5/5 in all tested lower ext groups Psych: normal mood and affect, speech clear   Right pelvis/hip: No deformity, swelling or wasting Hip ROM Flexion 90, ext 30, IR 45, ER 45 TTP significantly right SI joint, moderately sacral base, left SI joint, and mildly over gluteal musculature NTTP over the  greater trochanter  lumbar spine Negative log roll with FROM Negative FABER Negative FADIR Negative Piriformis test   Gait normal   Electronically signed by:  [name] 11:04 AM 04/28/23

## 2023-04-28 ENCOUNTER — Other Ambulatory Visit: Payer: Self-pay

## 2023-04-28 ENCOUNTER — Ambulatory Visit: Payer: BC Managed Care – PPO | Admitting: Sports Medicine

## 2023-04-28 VITALS — BP 120/78 | Ht 65.0 in | Wt 208.0 lb

## 2023-04-28 DIAGNOSIS — G8929 Other chronic pain: Secondary | ICD-10-CM | POA: Diagnosis not present

## 2023-04-28 DIAGNOSIS — M533 Sacrococcygeal disorders, not elsewhere classified: Secondary | ICD-10-CM | POA: Diagnosis not present

## 2023-04-28 NOTE — Patient Instructions (Addendum)
4 week follow up  PRP pamphlet

## 2023-05-18 NOTE — Progress Notes (Signed)
Desiree [name] [address] [place] [address] Phone: [phone]   Assessment and Plan:     1. 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  Ultimately could consider PRP injections if CSI become an active  2. Chronic bilateral thoracic back pain 3. Neck pain 4. Somatic dysfunction of cervical region 5. Somatic dysfunction of thoracic region 6. Somatic dysfunction of lumbar region 7. Somatic dysfunction of pelvic region 8. Somatic dysfunction of sacral region - Chronic with exacerbation, subsequent visit - Recurrence of multiple musculoskeletal complaints with most prominent being neck, upper back - Patient has received relief with OMT in the past.  Elects for repeat OMT today.  Tolerated well per note below. - Decision today to treat with OMT was based on Physical Exam   After verbal consent patient was treated with HVLA (high velocity low amplitude), ME (muscle energy), FPR (flex positional release), ST (soft tissue), PC/PD (Pelvic Compression/ Pelvic Decompression) techniques in cervical, sacrum, thoracic, lumbar, and pelvic areas. Patient tolerated the procedure well with improvement in symptoms.  Patient educated on potential side effects of soreness and recommended to rest, hydrate, and use Tylenol as needed for pain control.   Pertinent previous records reviewed include none   Follow Up: 4 to 6 weeks for reevaluation.  Could consider repeat OMT   Subjective:   I , Desiree Rivera, am serving as a Neurosurgeon for Doctor Richardean Sale   Chief Complaint: rib pain    HPI:    10/29/2022 Patient is a 37 year old female complaining of rib pain. Patient states that it has been going on for 3 years pain , hx of torn labrum, former runner , pain from boob to knee,  TTP ribcage around to the thoracic, floating rib pain that was adjusted by chiro didn't see a difference in pain , will get intermittent sharp pain through the rib cage makes her nausea at times, ib and tylenol has not helped , sometimes numbness and tingling when she moves a certain way    11/25/2022  Patient states ready for a tune up    12/09/2022 Patients states good , overall pain has gone down but still has pain when she wakes in the am and a little of pain in si joint but especially around the right rib cage , ribs TTP under her breast feels very "pressury and sore", top of hip is painful    12/24/2022 Patient states that she is good , right rib cage and outer hip are flared    01/20/2023 Patient states that she is good , she fell off on the meloxicam she forgot some days, overall she does feel good, has a lingering dull soreness in the SI join to the bottom rib cage , but outer hip feels good though   02/25/2023  Patient states SI joint is bothering her, neck and trap tightness on the right side with some rhomboids   03/25/2023 Patient states she is stressed,    04/28/2023 Patient states would like an injection . PT June 14th, did a stretch last night that helped with pain but pain came back when she woke up    05/26/2023 Patient states she is good , 4-5 days after injection she has not has any pain she is feeling good, decreased ROM neck and  pain, pain right hip deep glute and hip     Relevant Historical Information: Right hip labral tear Additional pertinent review of systems negative.  Current Outpatient Medications  Medication Sig Dispense Refill   baclofen (LIORESAL) 10 MG tablet Take 0.5-1 tablets (5-10 mg total) by mouth at bedtime as needed for muscle spasms. 30 each 3   celecoxib (CELEBREX) 100 MG capsule Take 2 capsules (200 mg total) by mouth daily. 60 capsule 1   meloxicam (MOBIC) 15 MG tablet Take 1 tablet (15 mg total) by mouth daily. 30 tablet 0   methocarbamol (ROBAXIN)  500 MG tablet TAKE 1 TABLET BY MOUTH 4 TIMES DAILY. 30 tablet 4   MULTIPLE VITAMIN PO Take by mouth.     Norgestimate-Ethinyl Estradiol Triphasic (TRI-SPRINTEC) 0.18/0.215/0.25 MG-35 MCG tablet Take 1 tablet by mouth daily. 84 tablet 3   No current facility-administered medications for this visit.      Objective:     Vitals:   05/26/23 1522  BP: 124/80  Pulse: (!) 102  SpO2: 99%  Weight: 207 lb (93.9 kg)  Height: 5\' 5"  (1.651 m)      Body mass index is 34.45 kg/m.    Physical Exam:     General: Well-appearing, cooperative, sitting comfortably in no acute distress.   OMT Physical Exam:  ASIS Compression Test: Positive Right Cervical: TTP paraspinal, C4-7 RR SR Sacrum: Positive sphinx, TTP bilateral sacral base, worse on right Thoracic: TTP paraspinal, T4-7 RRSL, T8-10 RLSR Lumbar: TTP paraspinal, L2 RLSL Pelvis: Right anterior innominate  Electronically signed by:  Desiree [name] 4:17 PM 05/26/23

## 2023-05-26 ENCOUNTER — Ambulatory Visit: Payer: BC Managed Care – PPO | Admitting: Sports Medicine

## 2023-05-26 VITALS — BP 124/80 | HR 102 | Ht 65.0 in | Wt 207.0 lb

## 2023-05-26 DIAGNOSIS — M9905 Segmental and somatic dysfunction of pelvic region: Secondary | ICD-10-CM

## 2023-05-26 DIAGNOSIS — M9904 Segmental and somatic dysfunction of sacral region: Secondary | ICD-10-CM

## 2023-05-26 DIAGNOSIS — M533 Sacrococcygeal disorders, not elsewhere classified: Secondary | ICD-10-CM | POA: Diagnosis not present

## 2023-05-26 DIAGNOSIS — M9902 Segmental and somatic dysfunction of thoracic region: Secondary | ICD-10-CM

## 2023-05-26 DIAGNOSIS — M9903 Segmental and somatic dysfunction of lumbar region: Secondary | ICD-10-CM | POA: Diagnosis not present

## 2023-05-26 DIAGNOSIS — M9901 Segmental and somatic dysfunction of cervical region: Secondary | ICD-10-CM

## 2023-05-26 DIAGNOSIS — G8929 Other chronic pain: Secondary | ICD-10-CM

## 2023-05-26 DIAGNOSIS — M542 Cervicalgia: Secondary | ICD-10-CM

## 2023-05-26 DIAGNOSIS — M546 Pain in thoracic spine: Secondary | ICD-10-CM | POA: Diagnosis not present

## 2023-05-26 NOTE — Patient Instructions (Signed)
5-6 week follow up  Trap HEP

## 2023-05-31 ENCOUNTER — Ambulatory Visit: Payer: Self-pay | Admitting: *Deleted

## 2023-05-31 ENCOUNTER — Other Ambulatory Visit: Payer: Self-pay

## 2023-05-31 DIAGNOSIS — Z3041 Encounter for surveillance of contraceptive pills: Secondary | ICD-10-CM

## 2023-05-31 DIAGNOSIS — Z01419 Encounter for gynecological examination (general) (routine) without abnormal findings: Secondary | ICD-10-CM

## 2023-05-31 MED ORDER — NORGESTIM-ETH ESTRAD TRIPHASIC 0.18/0.215/0.25 MG-35 MCG PO TABS: 1.0000 | ORAL_TABLET | Freq: Every day | ORAL | 0 refills | Status: DC

## 2023-05-31 NOTE — Telephone Encounter (Signed)
  Chief Complaint: Heart racing Symptoms: States heart rate 90-100 for 45 minutes last night after going to bed. HR 62 this AM "Normal for me." States may be anxiety, "Just need a check up as it's been a while." Frequency: Last night Pertinent Negatives: Patient denies chest pain, chest tightness, SOB,dizziness Disposition: [] ED /[] Urgent Care (no appt availability in office) / [x] Appointment(In office/virtual)/ []  [facility]/ [] Home Care/ [] Refused Recommended Disposition /[] [facility]/ []  Follow-up with PCP Additional Notes: Appt secured for tomorrow AM. Care advise provided, pt verbalizes understanding. Reason for Disposition  [1] Palpitations AND [2] no improvement after using Care Advice  Additional Information  Commented on: History of hyperthyroidism or taking thyroid medication    No palpitations, one episode of heart "Racing"  Answer Assessment - Initial Assessment Questions 1. DESCRIPTION: "Please describe your heart rate or heartbeat that you are having" (e.g., fast/slow, regular/irregular, skipped or extra beats, "palpitations")     *No Answer* 2. ONSET: "When did it start?" (Minutes, hours or days)      *No Answer* 3. DURATION: "How long does it last" (e.g., seconds, minutes, hours)     *No Answer* 4. PATTERN "Does it come and go, or has it been constant since it started?"  "Does it get worse with exertion?"   "Are you feeling it now?"     *No Answer* 5. TAP: "Using your hand, can you tap out what you are feeling on a chair or table in front of you, so that I can hear?" (Note: not all patients can do this)       *No Answer* 6. HEART RATE: "Can you tell me your heart rate?" "How many beats in 15 seconds?"  (Note: not all patients can do this)       *No Answer* 7. RECURRENT SYMPTOM: "Have you ever had this before?" If Yes, ask: "When was the last time?" and "What happened that time?"      *No Answer* 8. CAUSE: "What do you think is causing the  palpitations?"     *No Answer* 9. CARDIAC HISTORY: "Do you have any history of heart disease?" (e.g., heart attack, angina, bypass surgery, angioplasty, arrhythmia)      *No Answer* 10. OTHER SYMPTOMS: "Do you have any other symptoms?" (e.g., dizziness, chest pain, sweating, difficulty breathing)       *No Answer*  Protocols used: Heart Rate and Heartbeat Questions-A-AH

## 2023-06-01 ENCOUNTER — Encounter: Payer: Self-pay | Admitting: Physician Assistant

## 2023-06-01 ENCOUNTER — Ambulatory Visit: Payer: BC Managed Care – PPO | Admitting: Physician Assistant

## 2023-06-01 VITALS — BP 131/92 | HR 99 | Temp 98.8°F | Ht 65.0 in | Wt 207.0 lb

## 2023-06-01 DIAGNOSIS — F419 Anxiety disorder, unspecified: Secondary | ICD-10-CM | POA: Diagnosis not present

## 2023-06-01 DIAGNOSIS — Z833 Family history of diabetes mellitus: Secondary | ICD-10-CM

## 2023-06-01 DIAGNOSIS — R002 Palpitations: Secondary | ICD-10-CM

## 2023-06-01 DIAGNOSIS — R635 Abnormal weight gain: Secondary | ICD-10-CM | POA: Diagnosis not present

## 2023-06-01 DIAGNOSIS — E669 Obesity, unspecified: Secondary | ICD-10-CM | POA: Diagnosis not present

## 2023-06-01 NOTE — Progress Notes (Unsigned)
Established patient visit  Patient: Desiree Rivera   DOB: 1986/06/02   37 y.o. Female  MRN: [id number] Visit Date: 06/01/2023  Today's healthcare provider: Debera Lat, PA-C   No chief complaint on file.  Subjective    HPI HPI   Sunday night before bed pt noticed her heart started beating fast suddenly. Pulse got up to 135 bpm. Pt felt very anxious and cold. Pt has a stressful job and works 6 days week, financial stress at home lately. Pt has not noticed tachycardia since. Last edited by Daneen Schick, CMA on 06/01/2023  8:37 AM.       Discussed the use of AI scribe software for clinical note transcription with the patient, who gave verbal consent to proceed.  History of Present Illness              06/01/2023    8:39 AM 10/07/2020    3:19 PM 11/18/2017    3:09 PM  PHQ9 SCORE ONLY  PHQ-9 Total Score 2 0 0      06/01/2023    8:39 AM  GAD 7 : Generalized Anxiety Score  Nervous, Anxious, on Edge 2  Control/stop worrying 2  Worry too much - different things 3  Trouble relaxing 2  Restless 0  Easily annoyed or irritable 1  Afraid - awful might happen 0  Total GAD 7 Score 10  Anxiety Difficulty Somewhat difficult        06/01/2023    8:39 AM 10/07/2020    3:19 PM 11/18/2017    3:09 PM  Depression screen PHQ 2/9  Decreased Interest 0 0 0  Down, Depressed, Hopeless 0 0 0  PHQ - 2 Score 0 0 0  Altered sleeping 1    Tired, decreased energy 1    Change in appetite 0    Feeling bad or failure about yourself  0    Trouble concentrating 0    Moving slowly or fidgety/restless 0    Suicidal thoughts 0    PHQ-9 Score 2    Difficult doing work/chores Not difficult at all      Medications: Outpatient Medications Prior to Visit  Medication Sig   Norgestimate-Ethinyl Estradiol Triphasic (TRI-SPRINTEC) 0.18/0.215/0.25 MG-35 MCG tablet Take 1 tablet by mouth daily.   baclofen (LIORESAL) 10 MG tablet Take 0.5-1 tablets (5-10 mg total) by mouth at bedtime as needed for  muscle spasms. (Patient not taking: Reported on 06/01/2023)   celecoxib (CELEBREX) 100 MG capsule Take 2 capsules (200 mg total) by mouth daily. (Patient not taking: Reported on 06/01/2023)   meloxicam (MOBIC) 15 MG tablet Take 1 tablet (15 mg total) by mouth daily. (Patient not taking: Reported on 06/01/2023)   methocarbamol (ROBAXIN) 500 MG tablet TAKE 1 TABLET BY MOUTH 4 TIMES DAILY. (Patient not taking: Reported on 06/01/2023)   MULTIPLE VITAMIN PO Take by mouth. (Patient not taking: Reported on 06/01/2023)   No facility-administered medications prior to visit.    Review of Systems Except see HPI   {Labs  Heme  Chem  Endocrine  Serology  Results Review (optional):23779}   Objective    BP (!) 131/92   Pulse 99   Temp 98.8 F (37.1 C) (Oral)   Ht 5\' 5"  (1.651 m)   Wt 207 lb (93.9 kg)   SpO2 100%   BMI 34.45 kg/m  {Show previous vital signs (optional):23777}  Physical Exam   No results found for any visits on 06/01/23.  Assessment & Plan    *** Assessment  and Plan              No follow-ups on file.     The patient was advised to call back or seek an in-person evaluation if the symptoms worsen or if the condition fails to improve as anticipated.  I discussed the assessment and treatment plan with the patient. The patient was provided an opportunity to ask questions and all were answered. The patient agreed with the plan and demonstrated an understanding of the instructions.  I, Debera Lat, PA-C have reviewed all documentation for this visit. The documentation on  06/01/23  for the exam, diagnosis, procedures, and orders are all accurate and complete.  Debera Lat, [facility], MMS [facility] [phone] (phone) [phone] (fax)  [facility]

## 2023-06-02 DIAGNOSIS — R002 Palpitations: Secondary | ICD-10-CM | POA: Insufficient documentation

## 2023-06-02 DIAGNOSIS — F419 Anxiety disorder, unspecified: Secondary | ICD-10-CM | POA: Insufficient documentation

## 2023-06-02 DIAGNOSIS — Z833 Family history of diabetes mellitus: Secondary | ICD-10-CM | POA: Insufficient documentation

## 2023-06-02 DIAGNOSIS — R635 Abnormal weight gain: Secondary | ICD-10-CM | POA: Insufficient documentation

## 2023-06-02 LAB — LIPID PANEL
Chol/HDL Ratio: 1.8 ratio (ref 0.0–4.4)
Cholesterol, Total: 203 mg/dL — ABNORMAL HIGH (ref 100–199)
HDL: 113 mg/dL (ref >39)
LDL Chol Calc (NIH): 77 mg/dL (ref 0–99)
Triglycerides: 73 mg/dL (ref 0–149)
VLDL Cholesterol Cal: 13 mg/dL (ref 5–40)

## 2023-06-02 LAB — COMPREHENSIVE METABOLIC PANEL
ALT: 12 IU/L (ref 0–32)
AST: 18 IU/L (ref 0–40)
Albumin: 4.5 g/dL (ref 3.9–4.9)
Alkaline Phosphatase: 64 IU/L (ref 44–121)
BUN/Creatinine Ratio: 13 (ref 9–23)
BUN: 10 mg/dL (ref 6–20)
Bilirubin Total: 0.3 mg/dL (ref 0.0–1.2)
CO2: 24 mmol/L (ref 20–29)
Calcium: 9.6 mg/dL (ref 8.7–10.2)
Chloride: 102 mmol/L (ref 96–106)
Creatinine, Ser: 0.77 mg/dL (ref 0.57–1.00)
Globulin, Total: 2.1 g/dL (ref 1.5–4.5)
Glucose: 100 mg/dL — ABNORMAL HIGH (ref 70–99)
Potassium: 4.5 mmol/L (ref 3.5–5.2)
Sodium: 138 mmol/L (ref 134–144)
Total Protein: 6.6 g/dL (ref 6.0–8.5)
eGFR: 103 mL/min/{1.73_m2} (ref >59)

## 2023-06-02 LAB — CBC WITH DIFFERENTIAL/PLATELET
Basophils Absolute: 0.1 10*3/uL (ref 0.0–0.2)
Basos: 1 %
EOS (ABSOLUTE): 0.1 10*3/uL (ref 0.0–0.4)
Eos: 1 %
Hematocrit: 38.7 % (ref 34.0–46.6)
Hemoglobin: 13.7 g/dL (ref 11.1–15.9)
Immature Grans (Abs): 0 10*3/uL (ref 0.0–0.1)
Immature Granulocytes: 0 %
Lymphocytes Absolute: 2.9 10*3/uL (ref 0.7–3.1)
Lymphs: 33 %
MCH: 33.6 pg — ABNORMAL HIGH (ref 26.6–33.0)
MCHC: 35.4 g/dL (ref 31.5–35.7)
MCV: 95 fL (ref 79–97)
Monocytes Absolute: 0.7 10*3/uL (ref 0.1–0.9)
Monocytes: 8 %
Neutrophils Absolute: 5.1 10*3/uL (ref 1.4–7.0)
Neutrophils: 57 %
Platelets: 291 10*3/uL (ref 150–450)
RBC: 4.08 x10E6/uL (ref 3.77–5.28)
RDW: 11.5 % — ABNORMAL LOW (ref 11.7–15.4)
WBC: 8.8 10*3/uL (ref 3.4–10.8)

## 2023-06-02 LAB — HEMOGLOBIN A1C
Est. average glucose Bld gHb Est-mCnc: 100 mg/dL
Hgb A1c MFr Bld: 5.1 % (ref 4.8–5.6)

## 2023-06-02 LAB — T4, FREE: Free T4: 1.16 ng/dL (ref 0.82–1.77)

## 2023-06-02 LAB — TSH: TSH: 1.5 u[IU]/mL (ref 0.450–4.500)

## 2023-06-14 ENCOUNTER — Ambulatory Visit (INDEPENDENT_AMBULATORY_CARE_PROVIDER_SITE_OTHER): Payer: BC Managed Care – PPO | Admitting: Licensed Practical Nurse

## 2023-06-14 ENCOUNTER — Encounter: Payer: Self-pay | Admitting: Licensed Practical Nurse

## 2023-06-14 VITALS — BP 129/92 | HR 70 | Ht 65.0 in | Wt 205.3 lb

## 2023-06-14 DIAGNOSIS — Z01419 Encounter for gynecological examination (general) (routine) without abnormal findings: Secondary | ICD-10-CM

## 2023-06-14 DIAGNOSIS — Z3041 Encounter for surveillance of contraceptive pills: Secondary | ICD-10-CM

## 2023-06-14 DIAGNOSIS — Z6834 Body mass index (BMI) 34.0-34.9, adult: Secondary | ICD-10-CM

## 2023-06-14 NOTE — Progress Notes (Unsigned)
Gynecology Annual Exam   PCP: Malva Limes, MD  Chief Complaint:  Chief Complaint  Patient presents with   Gynecologic Exam    History of Present Illness: Patient is a 37 y.o. G3P0030 presents for annual exam. The patient is concerned because her bP is elevated today and it was elevated at a recent visit with her PCP. Denies family hx of HTN. Does admit to being under a lot of stress.   LMP: Patient's last menstrual period was 06/08/2023 (approximate). Average Interval: regular, monthly, her last cycle was off by 5 days. Normally comes at the start of the placebo week.  Duration of flow:  5-6  days Heavy Menses: no Clots: no Intermenstrual Bleeding: no Postcoital Bleeding: no Dysmenorrhea: no  The patient is sexually active. She currently uses OCP (estrogen/progesterone) for contraception. She denies dyspareunia.  But has noted a decreased interest in sex over the last year or so, she is very much in love with her partner but does not have the same desires as she once did in her 62's. He is 50, as far as she knows he does not seem to notice. The patient does perform self breast exams.  There is no notable family history of breast or ovarian cancer in her family.  The patient wears seatbelts: yes.   The patient has regular exercise: yes.  But continues to struggle with injuries, uses a treadmill and weights at home. She has been trying to lose weight and cannot. This continues to make her feel depressed.   The patient reports current symptoms of depression.  Reports being under a lot of stress related to her new boss. This is causing anxiety. She finds herself up at night overwhelmed with thoughts, she "cannot shut her brain off",  She is not interested in counseling. She has tried exercise   Review of Systems: ROS see HPI   Past Medical History:  Patient Active Problem List   Diagnosis Date Noted Date Diagnosed   Palpitations 06/02/2023    Family history of diabetes  mellitus (DM) 06/02/2023    Anxiety 06/02/2023    Weight gain 06/02/2023    Personal history of other diseases of the female genital tract 11/25/2016    History of other malignant neoplasm of skin 11/25/2016     on nose    Allergic rhinitis 11/25/2016    Cephalalgia 04/20/2006     Past Surgical History:  Past Surgical History:  Procedure Laterality Date   LEEP     TONSILLECTOMY     WISDOM TOOTH EXTRACTION      Gynecologic History:  Patient's last menstrual period was 06/08/2023 (approximate). Contraception: OCP (estrogen/progesterone) Last Pap: Results were: NIL and HR HPV negative   Obstetric History: G3P0030  Family History:  Family History  Problem Relation Age of Onset   Diabetes Mellitus II Maternal Grandmother    Thyroid cancer Maternal Aunt    Lung cancer Maternal Grandfather     Social History:  Social History   Socioeconomic History   Marital status: Married    Spouse name: Not on file   Number of children: Not on file   Years of education: Not on file   Highest education level: Not on file  Occupational History   Not on file  Tobacco Use   Smoking status: Former    Current packs/day: 0.00    Types: Cigarettes    Quit date: 11/24/2007    Years since quitting: 15.5   Smokeless tobacco: Never  Vaping Use   Vaping status: Never Used  Substance and Sexual Activity   Alcohol use: Not on file   Drug use: No   Sexual activity: Yes    Partners: Male    Birth control/protection: Pill  Other Topics Concern   Not on file  Social History Narrative   Not on file   Social Determinants of Health   Financial Resource Strain: Not on file  Food Insecurity: Not on file  Transportation Needs: Not on file  Physical Activity: Not on file  Stress: Not on file  Social Connections: Not on file  Intimate Partner Violence: Not on file    Allergies:  Allergies  Allergen Reactions   Sulfa Antibiotics Rash    Medications: Prior to Admission medications    Medication Sig Start Date End Date Taking? Authorizing Provider  Norgestimate-Ethinyl Estradiol Triphasic (TRI-SPRINTEC) 0.18/0.215/0.25 MG-35 MCG tablet Take 1 tablet by mouth daily. 05/31/23  Yes Deberah Adolf, Courtney Heys, CNM  baclofen (LIORESAL) 10 MG tablet Take 0.5-1 tablets (5-10 mg total) by mouth at bedtime as needed for muscle spasms. Patient not taking: Reported on 06/01/2023 03/25/21   Hilts, Casimiro Needle, MD  celecoxib (CELEBREX) 100 MG capsule Take 2 capsules (200 mg total) by mouth daily. Patient not taking: Reported on 06/01/2023 11/02/22   Madelyn Brunner, DO  meloxicam (MOBIC) 15 MG tablet Take 1 tablet (15 mg total) by mouth daily. Patient not taking: Reported on 06/01/2023 12/24/22   Richardean Sale, DO  methocarbamol (ROBAXIN) 500 MG tablet TAKE 1 TABLET BY MOUTH 4 TIMES DAILY. Patient not taking: Reported on 06/01/2023 08/22/21   Malva Limes, MD  MULTIPLE VITAMIN PO Take by mouth. Patient not taking: Reported on 06/01/2023    [provider]    Physical Exam Vitals: Blood pressure (!) 134/94, pulse 73, height 5\' 5"  (1.651 m), weight 205 lb 4.8 oz (93.1 kg), last menstrual period 06/08/2023.  General: NAD HEENT: normocephalic, anicteric Thyroid: no enlargement, no palpable nodules Pulmonary: No increased work of breathing, CTAB Cardiovascular: RRR, distal pulses 2+ Breast: Breast symmetrical, no tenderness, no palpable nodules or masses, no skin or nipple retraction present, no nipple discharge.  No axillary or supraclavicular lymphadenopathy. Abdomen: NABS, soft, non-tender, non-distended.  Umbilicus without lesions.  No hepatomegaly, splenomegaly or masses palpable. No evidence of hernia  Genitourinary:  External: Normal external female genitalia.  Normal urethral meatus, normal Bartholin's and Skene's glands.    Vagina: Normal vaginal mucosa, no evidence of prolapse.  Good tone   Cervix: spec exam deferred  Uterus: Non-enlarged, mobile, normal contour.  No CMT  Adnexa:  ovaries non-enlarged, no adnexal masses  Rectal: deferred  Lymphatic: no evidence of inguinal lymphadenopathy Extremities: no edema, erythema, or tenderness Neurologic: Grossly intact Psychiatric: mood appropriate, affect full   Assessment: 37 y.o. G3P0030 routine annual exam  Plan: Problem List Items Addressed This Visit   None Visit Diagnoses     BMI 34.0-34.9,adult    -  Primary   Relevant Orders   Ambulatory referral to Family Practice       2) STI screening  wasoffered and declined  2)  ASCCP guidelines and rational discussed.  Patient opts for every 3 years screening interval  3) Contraception - the patient is currently using  OCP (estrogen/progesterone).  She is happy with her current form of contraception and plans to continue  4) Routine healthcare maintenance including cholesterol, diabetes screening discussed managed by PCP  5) referral made to Dr Dalbert Garnet for weight loss   6)  anxiety: rec therapy   Carie Caddy, CNM   [facility] 06/14/2023, 5:55 PM   .

## 2023-06-15 ENCOUNTER — Encounter: Payer: Self-pay | Admitting: Family Medicine

## 2023-06-15 ENCOUNTER — Ambulatory Visit: Payer: BC Managed Care – PPO | Admitting: Family Medicine

## 2023-06-15 VITALS — BP 130/88 | HR 69 | Ht 65.0 in | Wt 202.6 lb

## 2023-06-15 DIAGNOSIS — R002 Palpitations: Secondary | ICD-10-CM | POA: Diagnosis not present

## 2023-06-15 MED ORDER — NORGESTIM-ETH ESTRAD TRIPHASIC 0.18/0.215/0.25 MG-35 MCG PO TABS
1.0000 | ORAL_TABLET | Freq: Every day | ORAL | 3 refills | Status: DC
Start: 2023-06-15 — End: 2024-08-03

## 2023-06-15 NOTE — Progress Notes (Signed)
Established patient visit   Patient: Desiree Rivera   DOB: 05-Oct-1986   36 y.o. Female  MRN: [id number] Visit Date: 06/15/2023  Today's healthcare provider: Mila Merry, MD    Subjective    Discussed the use of AI scribe software for clinical note transcription with the patient, who gave verbal consent to proceed.  History of Present Illness   The patient presented two weeks ago with an episode of tachycardia. She reported feeling her heart rate elevated before bed, with a pulse of 135 beats per minute at rest. The patient managed to reduce it to around 100 beats per minute after 15 minutes of deep breathing. The following morning, her heart rate was back to normal at 60 beats per minute. This was the first time the patient experienced such an episode, which caused her anxiety. She came in to see [facility] and found to have elevated blood pressure. She had extensive labs including CMP, CBC, lipids, and thyroid functions which were all normal.  She has since been monitoring her blood pressure at home, with readings returning to normal after the initial few measurements. she has not had any episodes of elevated heart rate or palpitations since initial episode prior to last vist.   In addition to these cardiovascular concerns, the patient has been dealing with a torn labrum cartilage in her hip, which has limited her ability to exercise and led to a weight gain of 40 pounds over the past two years. This has caused her significant distress, as she is unable to work out as she would like due to the hip pain. She has been seeing orthopedics and undergoing manipulation sessions once a month, and has had two cortisone injections.  The patient has been trying to make lifestyle changes to manage her health better, including managing stress at work. She acknowledged the need to lose weight and is making efforts towards this.       Medications: Outpatient Medications Prior to Visit   Medication Sig   Norgestimate-Ethinyl Estradiol Triphasic (TRI-SPRINTEC) 0.18/0.215/0.25 MG-35 MCG tablet Take 1 tablet by mouth daily.   baclofen (LIORESAL) 10 MG tablet Take 0.5-1 tablets (5-10 mg total) by mouth at bedtime as needed for muscle spasms. (Patient not taking: Reported on 06/01/2023)   celecoxib (CELEBREX) 100 MG capsule Take 2 capsules (200 mg total) by mouth daily. (Patient not taking: Reported on 06/01/2023)   meloxicam (MOBIC) 15 MG tablet Take 1 tablet (15 mg total) by mouth daily. (Patient not taking: Reported on 06/01/2023)   methocarbamol (ROBAXIN) 500 MG tablet TAKE 1 TABLET BY MOUTH 4 TIMES DAILY. (Patient not taking: Reported on 06/01/2023)   MULTIPLE VITAMIN PO Take by mouth. (Patient not taking: Reported on 06/01/2023)   No facility-administered medications prior to visit.       Objective    BP 130/88 (BP Location: Right Arm, Patient Position: Sitting, Cuff Size: Normal) Comment: home cuff  Pulse 69   Ht 5\' 5"  (1.651 m)   Wt 202 lb 9.6 oz (91.9 kg)   LMP 06/08/2023 (Approximate)   BMI 33.71 kg/m    General appearance: Mildly obese female, cooperative and in no acute distress Head: Normocephalic, without obvious abnormality, atraumatic Respiratory: Respirations even and unlabored, normal respiratory rate Extremities: All extremities are intact.  Skin: Skin color, texture, turgor normal. No rashes seen  Psych: Appropriate mood and affect. Neurologic: Mental status: Alert, oriented to person, place, and time, thought content appropriate.     EKG: Sinus  rhythm  Assessment & Plan     Assessment and Plan    Tachycardia: Single episode of elevated heart rate (135 bpm) at rest, self-resolved within an hour. No recurrent episodes. Normal EKG in office. - No further action required at this time. - If recurrent episodes, consider Zio monitor.  Single episode of elevated blood pressure in office, subsequent readings at home within normal range. - No further action  required at this time. - Continue to monitor blood pressure at home.  Stress/Anxiety: Reports high stress job and difficulty managing stress. - Encouraged to continue lifestyle modifications and stress management techniques.  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 - Continue current management plan.          Mila Merry, MD  [facility] [phone] (phone) [phone] (fax)  [facility]

## 2023-06-29 NOTE — Progress Notes (Deleted)
Desiree Rivera D.[name] [address] [place] [address] Phone: [phone]   Assessment and Plan:     There are no diagnoses linked to this encounter.  *** - Patient has received relief with OMT in the past.  Elects for repeat OMT today.  Tolerated well per note below. - Decision today to treat with OMT was based on Physical Exam   After verbal consent patient was treated with HVLA (high velocity low amplitude), ME (muscle energy), FPR (flex positional release), ST (soft tissue), PC/PD (Pelvic Compression/ Pelvic Decompression) techniques in cervical, rib, thoracic, lumbar, and pelvic areas. Patient tolerated the procedure well with improvement in symptoms.  Patient educated on potential side effects of soreness and recommended to rest, hydrate, and use Tylenol as needed for pain control.   Pertinent previous records reviewed include ***   Follow Up: ***     Subjective:   I ,  , am serving as a Neurosurgeon for Doctor Richardean Sale   Chief Complaint: rib pain    HPI:    10/29/2022 Patient is a 37 year old female complaining of rib pain. Patient states that it has been going on for 3 years pain , hx of torn labrum, former runner , pain from boob to knee, TTP ribcage around to the thoracic, floating rib pain that was adjusted by chiro didn't see a difference in pain , will get intermittent sharp pain through the rib cage makes her nausea at times, ib and tylenol has not helped , sometimes numbness and tingling when she moves a certain way    11/25/2022  Patient states ready for a tune up    12/09/2022 Patients states good , overall pain has gone down but still has pain when she wakes in the am and a little of pain in si joint but especially around the right rib cage , ribs TTP under her breast feels very "pressury and sore", top of hip is painful    12/24/2022 Patient states that she is good , right rib cage and outer hip are flared     01/20/2023 Patient states that she is good , she fell off on the meloxicam she forgot some days, overall she does feel good, has a lingering dull soreness in the SI join to the bottom rib cage , but outer hip feels good though   02/25/2023  Patient states SI joint is bothering her, neck and trap tightness on the right side with some rhomboids   03/25/2023 Patient states she is stressed,    04/28/2023 Patient states would like an injection . PT June 14th, did a stretch last night that helped with pain but pain came back when she woke up    05/26/2023 Patient states she is good , 4-5 days after injection she has not has any pain she is feeling good, decreased ROM neck and pain, pain right hip deep glute and hip    06/30/2023 Patient states    Relevant Historical Information: Right hip labral tear  Additional pertinent review of systems negative.  Current Outpatient Medications  Medication Sig Dispense Refill   baclofen (LIORESAL) 10 MG tablet Take 0.5-1 tablets (5-10 mg total) by mouth at bedtime as needed for muscle spasms. (Patient not taking: Reported on 06/01/2023) 30 each 3   celecoxib (CELEBREX) 100 MG capsule Take 2 capsules (200 mg total) by mouth daily. (Patient not taking: Reported on 06/01/2023) 60 capsule 1   meloxicam (MOBIC) 15 MG tablet Take  1 tablet (15 mg total) by mouth daily. (Patient not taking: Reported on 06/01/2023) 30 tablet 0   methocarbamol (ROBAXIN) 500 MG tablet TAKE 1 TABLET BY MOUTH 4 TIMES DAILY. (Patient not taking: Reported on 06/01/2023) 30 tablet 4   MULTIPLE VITAMIN PO Take by mouth. (Patient not taking: Reported on 06/01/2023)     Norgestimate-Ethinyl Estradiol Triphasic (TRI-SPRINTEC) 0.18/0.215/0.25 MG-35 MCG tablet Take 1 tablet by mouth daily. 84 tablet 3   No current facility-administered medications for this visit.      Objective:     There were no vitals filed for this visit.    There is no height or weight on file to calculate BMI.    Physical Exam:      General: Well-appearing, cooperative, sitting comfortably in no acute distress.   OMT Physical Exam:  ASIS Compression Test: Positive Right Cervical: TTP paraspinal, *** Rib: Bilateral elevated first rib with TTP Thoracic: TTP paraspinal,*** Lumbar: TTP paraspinal,*** Pelvis: Right anterior innominate  Electronically signed by:  Desiree Rivera D.[name] 12:44 PM 06/29/23

## 2023-06-30 ENCOUNTER — Ambulatory Visit: Payer: BC Managed Care – PPO | Admitting: Sports Medicine
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
## 2023-09-29 ENCOUNTER — Ambulatory Visit: Payer: BC Managed Care – PPO | Admitting: Sports Medicine

## 2023-09-29 VITALS — BP 134/78 | HR 72 | Ht 65.0 in | Wt 205.0 lb

## 2023-09-29 DIAGNOSIS — G8929 Other chronic pain: Secondary | ICD-10-CM | POA: Diagnosis not present

## 2023-09-29 DIAGNOSIS — M546 Pain in thoracic spine: Secondary | ICD-10-CM

## 2023-09-29 DIAGNOSIS — M9901 Segmental and somatic dysfunction of cervical region: Secondary | ICD-10-CM

## 2023-09-29 DIAGNOSIS — M9902 Segmental and somatic dysfunction of thoracic region: Secondary | ICD-10-CM

## 2023-09-29 DIAGNOSIS — M9904 Segmental and somatic dysfunction of sacral region: Secondary | ICD-10-CM

## 2023-09-29 DIAGNOSIS — M542 Cervicalgia: Secondary | ICD-10-CM | POA: Diagnosis not present

## 2023-09-29 DIAGNOSIS — M9903 Segmental and somatic dysfunction of lumbar region: Secondary | ICD-10-CM

## 2023-09-29 DIAGNOSIS — M533 Sacrococcygeal disorders, not elsewhere classified: Secondary | ICD-10-CM | POA: Diagnosis not present

## 2023-09-29 DIAGNOSIS — M9905 Segmental and somatic dysfunction of pelvic region: Secondary | ICD-10-CM

## 2023-09-29 MED ORDER — MELOXICAM 15 MG PO TABS: 15.0000 mg | ORAL_TABLET | Freq: Every day | ORAL | 0 refills | Status: DC

## 2023-09-29 NOTE — Patient Instructions (Addendum)
-   Start meloxicam 15 mg daily x2 weeks.  If still having pain after 2 weeks, complete 3rd-week of meloxicam. May use remaining meloxicam as needed once daily for pain control.  Do not to use additional NSAIDs while taking meloxicam.  May use Tylenol [phone] mg 2 to 3 times a day for breakthrough pain. 4 week follow up ,or sooner if you would like to discuss SI joint injection

## 2023-10-15 NOTE — Progress Notes (Unsigned)
Desiree Rivera D.[name] [address] [place] [address] Phone: [phone]   Assessment and Plan:     There are no diagnoses linked to this encounter.  *** - Patient has received relief with OMT in the past.  Elects for repeat OMT today.  Tolerated well per note below. - Decision today to treat with OMT was based on Physical Exam   After verbal consent patient was treated with HVLA (high velocity low amplitude), ME (muscle energy), FPR (flex positional release), ST (soft tissue), PC/PD (Pelvic Compression/ Pelvic Decompression) techniques in cervical, rib, thoracic, lumbar, and pelvic areas. Patient tolerated the procedure well with improvement in symptoms.  Patient educated on potential side effects of soreness and recommended to rest, hydrate, and use Tylenol as needed for pain control.   Pertinent previous records reviewed include ***    Follow Up: ***     Subjective:   I , [place], am serving as a Neurosurgeon for Doctor Richardean Sale   Chief Complaint: rib pain    HPI:    10/29/2022 Patient is a 37 year old female complaining of rib pain. Patient states that it has been going on for 3 years pain , hx of torn labrum, former runner , pain from boob to knee, TTP ribcage around to the thoracic, floating rib pain that was adjusted by chiro didn't see a difference in pain , will get intermittent sharp pain through the rib cage makes her nausea at times, ib and tylenol has not helped , sometimes numbness and tingling when she moves a certain way    11/25/2022  Patient states ready for a tune up    12/09/2022 Patients states good , overall pain has gone down but still has pain when she wakes in the am and a little of pain in si joint but especially around the right rib cage , ribs TTP under her breast feels very "pressury and sore", top of hip is painful    12/24/2022 Patient states that she is good , right rib cage and outer hip are flared     01/20/2023 Patient states that she is good , she fell off on the meloxicam she forgot some days, overall she does feel good, has a lingering dull soreness in the SI join to the bottom rib cage , but outer hip feels good though   02/25/2023  Patient states SI joint is bothering her, neck and trap tightness on the right side with some rhomboids   03/25/2023 Patient states she is stressed,    04/28/2023 Patient states would like an injection . PT June 14th, did a stretch last night that helped with pain but pain came back when she woke up    05/26/2023 Patient states she is good , 4-5 days after injection she has not has any pain she is feeling good, decreased ROM neck and pain, pain right hip deep glute and hip     09/29/2023 Patient states ready for an adjustment. Wants to discuss PRP   10/18/2023 Patient states   Relevant Historical Information: Right hip labral tear Additional pertinent review of systems negative.  Current Outpatient Medications  Medication Sig Dispense Refill   baclofen (LIORESAL) 10 MG tablet Take 0.5-1 tablets (5-10 mg total) by mouth at bedtime as needed for muscle spasms. (Patient not taking: Reported on 06/01/2023) 30 each 3   celecoxib (CELEBREX) 100 MG capsule Take 2 capsules (200 mg total) by mouth daily. (Patient not taking: Reported  on 06/01/2023) 60 capsule 1   meloxicam (MOBIC) 15 MG tablet Take 1 tablet (15 mg total) by mouth daily. (Patient not taking: Reported on 06/01/2023) 30 tablet 0   meloxicam (MOBIC) 15 MG tablet Take 1 tablet (15 mg total) by mouth daily. 30 tablet 0   methocarbamol (ROBAXIN) 500 MG tablet TAKE 1 TABLET BY MOUTH 4 TIMES DAILY. (Patient not taking: Reported on 06/01/2023) 30 tablet 4   MULTIPLE VITAMIN PO Take by mouth. (Patient not taking: Reported on 06/01/2023)     Norgestimate-Ethinyl Estradiol Triphasic (TRI-SPRINTEC) 0.18/0.215/0.25 MG-35 MCG tablet Take 1 tablet by mouth daily. 84 tablet 3   No current facility-administered medications  for this visit.      Objective:     There were no vitals filed for this visit.    There is no height or weight on file to calculate BMI.    Physical Exam:     General: Well-appearing, cooperative, sitting comfortably in no acute distress.   OMT Physical Exam:  ASIS Compression Test: Positive Right Cervical: TTP paraspinal, *** Rib: Bilateral elevated first rib with TTP Thoracic: TTP paraspinal,*** Lumbar: TTP paraspinal,*** Pelvis: Right anterior innominate  Electronically signed by:  Desiree Rivera [name] 10:19 AM 10/15/23

## 2023-10-18 ENCOUNTER — Ambulatory Visit: Payer: BC Managed Care – PPO | Admitting: Sports Medicine

## 2023-10-18 VITALS — HR 69 | Ht 65.0 in | Wt 207.0 lb

## 2023-10-18 DIAGNOSIS — M9905 Segmental and somatic dysfunction of pelvic region: Secondary | ICD-10-CM

## 2023-10-18 DIAGNOSIS — M9902 Segmental and somatic dysfunction of thoracic region: Secondary | ICD-10-CM | POA: Diagnosis not present

## 2023-10-18 DIAGNOSIS — M9901 Segmental and somatic dysfunction of cervical region: Secondary | ICD-10-CM

## 2023-10-18 DIAGNOSIS — M9903 Segmental and somatic dysfunction of lumbar region: Secondary | ICD-10-CM | POA: Diagnosis not present

## 2023-10-18 DIAGNOSIS — G8929 Other chronic pain: Secondary | ICD-10-CM

## 2023-10-18 DIAGNOSIS — M533 Sacrococcygeal disorders, not elsewhere classified: Secondary | ICD-10-CM | POA: Diagnosis not present

## 2023-10-18 DIAGNOSIS — M7631 Iliotibial band syndrome, right leg: Secondary | ICD-10-CM

## 2023-10-18 DIAGNOSIS — M546 Pain in thoracic spine: Secondary | ICD-10-CM

## 2023-10-18 DIAGNOSIS — M9904 Segmental and somatic dysfunction of sacral region: Secondary | ICD-10-CM

## 2023-10-18 NOTE — Patient Instructions (Signed)
IT band  2 week follow up

## 2023-10-29 ENCOUNTER — Other Ambulatory Visit: Payer: Self-pay | Admitting: Sports Medicine

## 2023-11-02 NOTE — Progress Notes (Unsigned)
Desiree Rivera D.[name] [address] [place] [address] Phone: [phone]   Assessment and Plan:     There are no diagnoses linked to this encounter.  *** - Patient has received relief with OMT in the past.  Elects for repeat OMT today.  Tolerated well per note below. - Decision today to treat with OMT was based on Physical Exam   After verbal consent patient was treated with HVLA (high velocity low amplitude), ME (muscle energy), FPR (flex positional release), ST (soft tissue), PC/PD (Pelvic Compression/ Pelvic Decompression) techniques in cervical, rib, thoracic, lumbar, and pelvic areas. Patient tolerated the procedure well with improvement in symptoms.  Patient educated on potential side effects of soreness and recommended to rest, hydrate, and use Tylenol as needed for pain control.   Pertinent previous records reviewed include ***    Follow Up: ***     Subjective:   I , Desiree Rivera, am serving as a Neurosurgeon for Doctor Richardean Sale   Chief Complaint: rib pain    HPI:    10/29/2022 Patient is a 37 year old female complaining of rib pain. Patient states that it has been going on for 3 years pain , hx of torn labrum, former runner , pain from boob to knee, TTP ribcage around to the thoracic, floating rib pain that was adjusted by chiro didn't see a difference in pain , will get intermittent sharp pain through the rib cage makes her nausea at times, ib and tylenol has not helped , sometimes numbness and tingling when she moves a certain way    11/25/2022  Patient states ready for a tune up    12/09/2022 Patients states good , overall pain has gone down but still has pain when she wakes in the am and a little of pain in si joint but especially around the right rib cage , ribs TTP under her breast feels very "pressury and sore", top of hip is painful    12/24/2022 Patient states that she is good , right rib cage and outer hip are flared     01/20/2023 Patient states that she is good , she fell off on the meloxicam she forgot some days, overall she does feel good, has a lingering dull soreness in the SI join to the bottom rib cage , but outer hip feels good though   02/25/2023  Patient states SI joint is bothering her, neck and trap tightness on the right side with some rhomboids   03/25/2023 Patient states she is stressed,    04/28/2023 Patient states would like an injection . PT June 14th, did a stretch last night that helped with pain but pain came back when she woke up    05/26/2023 Patient states she is good , 4-5 days after injection she has not has any pain she is feeling good, decreased ROM neck and pain, pain right hip deep glute and hip     09/29/2023 Patient states ready for an adjustment. Wants to discuss PRP    10/18/2023 Patient states she is here for an adjustment right side rib cage. SI joint has been decent . Right side knee pain lateral   11/03/2023 Patient states   Relevant Historical Information: Right hip labral tear Additional pertinent review of systems negative.  Current Outpatient Medications  Medication Sig Dispense Refill   baclofen (LIORESAL) 10 MG tablet Take 0.5-1 tablets (5-10 mg total) by mouth at bedtime as needed for muscle spasms. (Patient not  taking: Reported on 06/01/2023) 30 each 3   celecoxib (CELEBREX) 100 MG capsule Take 2 capsules (200 mg total) by mouth daily. (Patient not taking: Reported on 06/01/2023) 60 capsule 1   meloxicam (MOBIC) 15 MG tablet Take 1 tablet (15 mg total) by mouth daily. (Patient not taking: Reported on 06/01/2023) 30 tablet 0   meloxicam (MOBIC) 15 MG tablet Take 1 tablet (15 mg total) by mouth daily. 30 tablet 0   methocarbamol (ROBAXIN) 500 MG tablet TAKE 1 TABLET BY MOUTH 4 TIMES DAILY. (Patient not taking: Reported on 06/01/2023) 30 tablet 4   MULTIPLE VITAMIN PO Take by mouth. (Patient not taking: Reported on 06/01/2023)     Norgestimate-Ethinyl Estradiol Triphasic  (TRI-SPRINTEC) 0.18/0.215/0.25 MG-35 MCG tablet Take 1 tablet by mouth daily. 84 tablet 3   No current facility-administered medications for this visit.      Objective:     There were no vitals filed for this visit.    There is no height or weight on file to calculate BMI.    Physical Exam:     General: Well-appearing, cooperative, sitting comfortably in no acute distress.   OMT Physical Exam:  ASIS Compression Test: Positive Right Cervical: TTP paraspinal, *** Rib: Bilateral elevated first rib with TTP Thoracic: TTP paraspinal,*** Lumbar: TTP paraspinal,*** Pelvis: Right anterior innominate  Electronically signed by:  Desiree Rivera [name] 11:16 AM 11/02/23

## 2023-11-03 ENCOUNTER — Ambulatory Visit: Payer: BC Managed Care – PPO | Admitting: Sports Medicine

## 2023-11-03 VITALS — HR 55 | Ht 65.0 in | Wt 207.0 lb

## 2023-11-03 DIAGNOSIS — M9902 Segmental and somatic dysfunction of thoracic region: Secondary | ICD-10-CM

## 2023-11-03 DIAGNOSIS — M542 Cervicalgia: Secondary | ICD-10-CM

## 2023-11-03 DIAGNOSIS — M9903 Segmental and somatic dysfunction of lumbar region: Secondary | ICD-10-CM | POA: Diagnosis not present

## 2023-11-03 DIAGNOSIS — M9901 Segmental and somatic dysfunction of cervical region: Secondary | ICD-10-CM | POA: Diagnosis not present

## 2023-11-03 DIAGNOSIS — M9905 Segmental and somatic dysfunction of pelvic region: Secondary | ICD-10-CM

## 2023-11-03 DIAGNOSIS — M9908 Segmental and somatic dysfunction of rib cage: Secondary | ICD-10-CM

## 2023-11-03 DIAGNOSIS — M533 Sacrococcygeal disorders, not elsewhere classified: Secondary | ICD-10-CM

## 2023-11-03 DIAGNOSIS — G8929 Other chronic pain: Secondary | ICD-10-CM

## 2023-11-15 NOTE — Progress Notes (Signed)
 [name] D.[name] [address] [place] [address] Phone: [phone]   Assessment and Plan:     1. Chronic right SI joint pain 2. Neck pain 3. Somatic dysfunction of cervical region 4. Somatic dysfunction of thoracic region 5. Somatic dysfunction of lumbar region 6. Somatic dysfunction of pelvic region 7. Somatic dysfunction of sacral region  -Chronic with exacerbation, subsequent visit - Recurrent flare of primarily right-sided hip and SI joint pain.  Overall improvement with OMT, as needed meloxicam  use, intermittent SI joint CSI - Continue meloxicam  15 mg daily as needed for pain relief.  Recommend limiting chronic NSAIDs to 1-2 doses per week.  Patient may call for refill if needed - Continue HEP - Recommend turmeric, cherry extract supplementation for anti-inflammatory properties - Encouraged to continue weight loss journey.  Recommended my fitness pal or other similar app - Patient has received relief with OMT in the past.  Elects for repeat OMT today.  Tolerated well per note below. - Decision today to treat with OMT was based on Physical Exam  After verbal consent patient was treated with HVLA (high velocity low amplitude), ME (muscle energy), FPR (flex positional release), ST (soft tissue), PC/PD (Pelvic Compression/ Pelvic Decompression) techniques in cervical, sacrum, thoracic, lumbar, and pelvic areas. Patient tolerated the procedure well with improvement in symptoms.  Patient educated on potential side effects of soreness and recommended to rest, hydrate, and use Tylenol as needed for pain control.   Pertinent previous records reviewed include none  Follow Up: 4 weeks for reevaluation.  Could consider repeat OMT or right SI joint CSI   Subjective:   I , Desiree Rivera, am serving as a neurosurgeon for Doctor Morene Mace   Chief Complaint: rib pain    HPI:    10/29/2022 Patient is a 37 year old female complaining of  rib pain. Patient states that it has been going on for 3 years pain , hx of torn labrum, former runner , pain from boob to knee, TTP ribcage around to the thoracic, floating rib pain that was adjusted by chiro didn't see a difference in pain , will get intermittent sharp pain through the rib cage makes her nausea at times, ib and tylenol has not helped , sometimes numbness and tingling when she moves a certain way    11/25/2022  Patient states ready for a tune up    12/09/2022 Patients states good , overall pain has gone down but still has pain when she wakes in the am and a little of pain in si joint but especially around the right rib cage , ribs TTP under her breast feels very pressury and sore, top of hip is painful    12/24/2022 Patient states that she is good , right rib cage and outer hip are flared    01/20/2023 Patient states that she is good , she fell off on the meloxicam  she forgot some days, overall she does feel good, has a lingering dull soreness in the SI join to the bottom rib cage , but outer hip feels good though   02/25/2023  Patient states SI joint is bothering her, neck and trap tightness on the right side with some rhomboids   03/25/2023 Patient states she is stressed,    04/28/2023 Patient states would like an injection . PT June 14th, did a stretch last night that helped with pain but pain came back when she woke up    05/26/2023 Patient states  she is good , 4-5 days after injection she has not has any pain she is feeling good, decreased ROM neck and pain, pain right hip deep glute and hip     09/29/2023 Patient states ready for an adjustment. Wants to discuss PRP    10/18/2023 Patient states she is here for an adjustment right side rib cage. SI joint has been decent . Right side knee pain lateral    11/03/2023 Patient states she is feeling the best she has   11/25/2023 Patient states that she has good and bad days. Pain in R SI joint and R lumbar spine pain. Pain that  radiates down to the knee and R ankle. Had some trouble sleeping twice since last visit. Using meloxicam  prn.    Relevant Historical Information: Right hip labral tear  Additional pertinent review of systems negative.   Current Outpatient Medications:    baclofen  (LIORESAL ) 10 MG tablet, Take 0.5-1 tablets (5-10 mg total) by mouth at bedtime as needed for muscle spasms., Disp: 30 each, Rfl: 3   celecoxib  (CELEBREX ) 100 MG capsule, Take 2 capsules (200 mg total) by mouth daily., Disp: 60 capsule, Rfl: 1   meloxicam  (MOBIC ) 15 MG tablet, Take 1 tablet (15 mg total) by mouth daily., Disp: 30 tablet, Rfl: 0   meloxicam  (MOBIC ) 15 MG tablet, Take 1 tablet (15 mg total) by mouth daily., Disp: 30 tablet, Rfl: 0   methocarbamol  (ROBAXIN ) 500 MG tablet, TAKE 1 TABLET BY MOUTH 4 TIMES DAILY., Disp: 30 tablet, Rfl: 4   MULTIPLE VITAMIN PO, Take by mouth., Disp: , Rfl:    Norgestimate-Ethinyl Estradiol Triphasic (TRI-SPRINTEC) 0.18/0.215/0.25 MG-35 MCG tablet, Take 1 tablet by mouth daily., Disp: 84 tablet, Rfl: 3   Objective:     Vitals:   11/25/23 1457  BP: 120/82  Pulse: 64  SpO2: 98%  Weight: 212 lb (96.2 kg)  Height: 5' 5 (1.651 m)      Body mass index is 35.28 kg/m.    Physical Exam:    General: Well-appearing, cooperative, sitting comfortably in no acute distress.   OMT Physical Exam:   ASIS Compression Test: Positive Right Cervical: TTP paraspinal, C3 RR SL Sacrum: TTP bilateral sacral base, worse on right.  Positive sphinx Thoracic: TTP paraspinal, T3-5 RRSL, T6-8 RLSR Lumbar: TTP paraspinal, L1-3 RRSL Pelvis: Right anterior innominate    Electronically signed by:  Odis Mace D.[name] 5:04 PM 11/25/23

## 2023-11-25 ENCOUNTER — Ambulatory Visit: Payer: BC Managed Care – PPO | Admitting: Sports Medicine

## 2023-11-25 VITALS — BP 120/82 | HR 64 | Ht 65.0 in | Wt 212.0 lb

## 2023-11-25 DIAGNOSIS — M9902 Segmental and somatic dysfunction of thoracic region: Secondary | ICD-10-CM | POA: Diagnosis not present

## 2023-11-25 DIAGNOSIS — M9903 Segmental and somatic dysfunction of lumbar region: Secondary | ICD-10-CM

## 2023-11-25 DIAGNOSIS — G8929 Other chronic pain: Secondary | ICD-10-CM

## 2023-11-25 DIAGNOSIS — M542 Cervicalgia: Secondary | ICD-10-CM

## 2023-11-25 DIAGNOSIS — M9905 Segmental and somatic dysfunction of pelvic region: Secondary | ICD-10-CM

## 2023-11-25 DIAGNOSIS — M533 Sacrococcygeal disorders, not elsewhere classified: Secondary | ICD-10-CM | POA: Diagnosis not present

## 2023-11-25 DIAGNOSIS — M9904 Segmental and somatic dysfunction of sacral region: Secondary | ICD-10-CM

## 2023-11-25 DIAGNOSIS — M9901 Segmental and somatic dysfunction of cervical region: Secondary | ICD-10-CM

## 2023-12-01 DIAGNOSIS — L813 Cafe au lait spots: Secondary | ICD-10-CM | POA: Diagnosis not present

## 2023-12-01 DIAGNOSIS — L570 Actinic keratosis: Secondary | ICD-10-CM | POA: Diagnosis not present

## 2023-12-01 DIAGNOSIS — D229 Melanocytic nevi, unspecified: Secondary | ICD-10-CM | POA: Diagnosis not present

## 2023-12-01 DIAGNOSIS — L821 Other seborrheic keratosis: Secondary | ICD-10-CM | POA: Diagnosis not present

## 2023-12-01 DIAGNOSIS — Z7189 Other specified counseling: Secondary | ICD-10-CM | POA: Diagnosis not present

## 2023-12-21 NOTE — Progress Notes (Unsigned)
Desiree Rivera D.[facility] [address] [place] [address] Phone: [phone]   Assessment and Plan:     There are no diagnoses linked to this encounter.  *** - Patient has received relief with OMT in the past.  Elects for repeat OMT today.  Tolerated well per note below. - Decision today to treat with OMT was based on Physical Exam   After verbal consent patient was treated with HVLA (high velocity low amplitude), ME (muscle energy), FPR (flex positional release), ST (soft tissue), PC/PD (Pelvic Compression/ Pelvic Decompression) techniques in cervical, rib, thoracic, lumbar, and pelvic areas. Patient tolerated the procedure well with improvement in symptoms.  Patient educated on potential side effects of soreness and recommended to rest, hydrate, and use Tylenol as needed for pain control.   Pertinent previous records reviewed include ***    Follow Up: ***     Subjective:   I , Desiree Rivera, am serving as a Neurosurgeon for Doctor Richardean Sale   Chief Complaint: rib pain    HPI:    10/29/2022 Patient is a 38 year old female complaining of rib pain. Patient states that it has been going on for 3 years pain , hx of torn labrum, former runner , pain from boob to knee, TTP ribcage around to the thoracic, floating rib pain that was adjusted by chiro didn't see a difference in pain , will get intermittent sharp pain through the rib cage makes her nausea at times, ib and tylenol has not helped , sometimes numbness and tingling when she moves a certain way    11/25/2022  Patient states ready for a tune up    12/09/2022 Patients states good , overall pain has gone down but still has pain when she wakes in the am and a little of pain in si joint but especially around the right rib cage , ribs TTP under her breast feels very "pressury and sore", top of hip is painful    12/24/2022 Patient states that she is good , right rib cage and outer hip are flared     01/20/2023 Patient states that she is good , she fell off on the meloxicam she forgot some days, overall she does feel good, has a lingering dull soreness in the SI join to the bottom rib cage , but outer hip feels good though   02/25/2023  Patient states SI joint is bothering her, neck and trap tightness on the right side with some rhomboids   03/25/2023 Patient states she is stressed,    04/28/2023 Patient states would like an injection . PT June 14th, did a stretch last night that helped with pain but pain came back when she woke up    05/26/2023 Patient states she is good , 4-5 days after injection she has not has any pain she is feeling good, decreased ROM neck and pain, pain right hip deep glute and hip     09/29/2023 Patient states ready for an adjustment. Wants to discuss PRP    10/18/2023 Patient states she is here for an adjustment right side rib cage. SI joint has been decent . Right side knee pain lateral    11/03/2023 Patient states she is feeling the best she has    11/25/2023 Patient states that she has good and bad days. Pain in R SI joint and R lumbar spine pain. Pain that radiates down to the knee and R ankle. Had some trouble sleeping twice since last  visit. Using meloxicam prn.   12/22/2023 Patient states   Relevant Historical Information: Right hip labral tear  Additional pertinent review of systems negative.  Current Outpatient Medications  Medication Sig Dispense Refill   baclofen (LIORESAL) 10 MG tablet Take 0.5-1 tablets (5-10 mg total) by mouth at bedtime as needed for muscle spasms. 30 each 3   celecoxib (CELEBREX) 100 MG capsule Take 2 capsules (200 mg total) by mouth daily. 60 capsule 1   meloxicam (MOBIC) 15 MG tablet Take 1 tablet (15 mg total) by mouth daily. 30 tablet 0   meloxicam (MOBIC) 15 MG tablet Take 1 tablet (15 mg total) by mouth daily. 30 tablet 0   methocarbamol (ROBAXIN) 500 MG tablet TAKE 1 TABLET BY MOUTH 4 TIMES DAILY. 30 tablet 4    MULTIPLE VITAMIN PO Take by mouth.     Norgestimate-Ethinyl Estradiol Triphasic (TRI-SPRINTEC) 0.18/0.215/0.25 MG-35 MCG tablet Take 1 tablet by mouth daily. 84 tablet 3   No current facility-administered medications for this visit.      Objective:     There were no vitals filed for this visit.    There is no height or weight on file to calculate BMI.    Physical Exam:     General: Well-appearing, cooperative, sitting comfortably in no acute distress.   OMT Physical Exam:  ASIS Compression Test: Positive Right Cervical: TTP paraspinal, *** Rib: Bilateral elevated first rib with TTP Thoracic: TTP paraspinal,*** Lumbar: TTP paraspinal,*** Pelvis: Right anterior innominate  Electronically signed by:  Desiree Rivera D.[facility] 9:22 AM 12/21/23

## 2023-12-22 ENCOUNTER — Ambulatory Visit: Payer: BC Managed Care – PPO | Admitting: Sports Medicine

## 2023-12-22 VITALS — BP 110/78 | HR 74 | Ht 65.0 in | Wt 203.0 lb

## 2023-12-22 DIAGNOSIS — M533 Sacrococcygeal disorders, not elsewhere classified: Secondary | ICD-10-CM | POA: Diagnosis not present

## 2023-12-22 DIAGNOSIS — G8929 Other chronic pain: Secondary | ICD-10-CM

## 2023-12-22 DIAGNOSIS — M9903 Segmental and somatic dysfunction of lumbar region: Secondary | ICD-10-CM | POA: Diagnosis not present

## 2023-12-22 DIAGNOSIS — M9901 Segmental and somatic dysfunction of cervical region: Secondary | ICD-10-CM | POA: Diagnosis not present

## 2023-12-22 DIAGNOSIS — M542 Cervicalgia: Secondary | ICD-10-CM

## 2023-12-22 DIAGNOSIS — M9902 Segmental and somatic dysfunction of thoracic region: Secondary | ICD-10-CM

## 2023-12-22 DIAGNOSIS — M9904 Segmental and somatic dysfunction of sacral region: Secondary | ICD-10-CM

## 2023-12-22 DIAGNOSIS — M9905 Segmental and somatic dysfunction of pelvic region: Secondary | ICD-10-CM

## 2024-01-18 NOTE — Progress Notes (Unsigned)
 Desiree Rivera D.[name] [address] [place] [address] Phone: [phone]   Assessment and Plan:     There are no diagnoses linked to this encounter.  *** - Patient has received relief with OMT in the past.  Elects for repeat OMT today.  Tolerated well per note below. - Decision today to treat with OMT was based on Physical Exam   After verbal consent patient was treated with HVLA (high velocity low amplitude), ME (muscle energy), FPR (flex positional release), ST (soft tissue), PC/PD (Pelvic Compression/ Pelvic Decompression) techniques in cervical, rib, thoracic, lumbar, and pelvic areas. Patient tolerated the procedure well with improvement in symptoms.  Patient educated on potential side effects of soreness and recommended to rest, hydrate, and use Tylenol as needed for pain control.   Pertinent previous records reviewed include ***    Follow Up: ***     Subjective:   I , Desiree Rivera, am serving as a Neurosurgeon for Doctor Richardean Sale   Chief Complaint: rib pain    HPI:    10/29/2022 Patient is a 38 year old female complaining of rib pain. Patient states that it has been going on for 3 years pain , hx of torn labrum, former runner , pain from boob to knee, TTP ribcage around to the thoracic, floating rib pain that was adjusted by chiro didn't see a difference in pain , will get intermittent sharp pain through the rib cage makes her nausea at times, ib and tylenol has not helped , sometimes numbness and tingling when she moves a certain way    11/25/2022  Patient states ready for a tune up    12/09/2022 Patients states good , overall pain has gone down but still has pain when she wakes in the am and a little of pain in si joint but especially around the right rib cage , ribs TTP under her breast feels very "pressury and sore", top of hip is painful    12/24/2022 Patient states that she is good , right rib cage and outer hip are flared     01/20/2023 Patient states that she is good , she fell off on the meloxicam she forgot some days, overall she does feel good, has a lingering dull soreness in the SI join to the bottom rib cage , but outer hip feels good though   02/25/2023  Patient states SI joint is bothering her, neck and trap tightness on the right side with some rhomboids   03/25/2023 Patient states she is stressed,    04/28/2023 Patient states would like an injection . PT June 14th, did a stretch last night that helped with pain but pain came back when she woke up    05/26/2023 Patient states she is good , 4-5 days after injection she has not has any pain she is feeling good, decreased ROM neck and pain, pain right hip deep glute and hip     09/29/2023 Patient states ready for an adjustment. Wants to discuss PRP    10/18/2023 Patient states she is here for an adjustment right side rib cage. SI joint has been decent . Right side knee pain lateral    11/03/2023 Patient states she is feeling the best she has    11/25/2023 Patient states that she has good and bad days. Pain in R SI joint and R lumbar spine pain. Pain that radiates down to the knee and R ankle. Had some trouble sleeping twice since last  visit. Using meloxicam prn.    12/22/2023 Patient states that she is feeling okay but has some low back and hip tightness   01/19/2024 Patient states   Relevant Historical Information: Right hip labral tear  Additional pertinent review of systems negative.  Current Outpatient Medications  Medication Sig Dispense Refill   baclofen (LIORESAL) 10 MG tablet Take 0.5-1 tablets (5-10 mg total) by mouth at bedtime as needed for muscle spasms. 30 each 3   celecoxib (CELEBREX) 100 MG capsule Take 2 capsules (200 mg total) by mouth daily. 60 capsule 1   meloxicam (MOBIC) 15 MG tablet Take 1 tablet (15 mg total) by mouth daily. 30 tablet 0   meloxicam (MOBIC) 15 MG tablet Take 1 tablet (15 mg total) by mouth daily. 30 tablet 0    methocarbamol (ROBAXIN) 500 MG tablet TAKE 1 TABLET BY MOUTH 4 TIMES DAILY. 30 tablet 4   MULTIPLE VITAMIN PO Take by mouth.     Norgestimate-Ethinyl Estradiol Triphasic (TRI-SPRINTEC) 0.18/0.215/0.25 MG-35 MCG tablet Take 1 tablet by mouth daily. 84 tablet 3   No current facility-administered medications for this visit.      Objective:     There were no vitals filed for this visit.    There is no height or weight on file to calculate BMI.    Physical Exam:     General: Well-appearing, cooperative, sitting comfortably in no acute distress.   OMT Physical Exam:  ASIS Compression Test: Positive Right Cervical: TTP paraspinal, *** Rib: Bilateral elevated first rib with TTP Thoracic: TTP paraspinal,*** Lumbar: TTP paraspinal,*** Pelvis: Right anterior innominate  Electronically signed by:  Desiree Rivera [name] 7:37 AM 01/18/24

## 2024-01-19 ENCOUNTER — Ambulatory Visit: Payer: BC Managed Care – PPO | Admitting: Sports Medicine

## 2024-01-19 VITALS — BP 102/60 | HR 68 | Ht 65.0 in | Wt 202.2 lb

## 2024-01-19 DIAGNOSIS — M542 Cervicalgia: Secondary | ICD-10-CM

## 2024-01-19 DIAGNOSIS — M9904 Segmental and somatic dysfunction of sacral region: Secondary | ICD-10-CM | POA: Diagnosis not present

## 2024-01-19 DIAGNOSIS — G8929 Other chronic pain: Secondary | ICD-10-CM | POA: Diagnosis not present

## 2024-01-19 DIAGNOSIS — M9901 Segmental and somatic dysfunction of cervical region: Secondary | ICD-10-CM

## 2024-01-19 DIAGNOSIS — M9903 Segmental and somatic dysfunction of lumbar region: Secondary | ICD-10-CM | POA: Diagnosis not present

## 2024-01-19 DIAGNOSIS — M533 Sacrococcygeal disorders, not elsewhere classified: Secondary | ICD-10-CM

## 2024-01-19 DIAGNOSIS — M7061 Trochanteric bursitis, right hip: Secondary | ICD-10-CM

## 2024-01-19 DIAGNOSIS — M9905 Segmental and somatic dysfunction of pelvic region: Secondary | ICD-10-CM

## 2024-01-19 DIAGNOSIS — M9902 Segmental and somatic dysfunction of thoracic region: Secondary | ICD-10-CM

## 2024-01-19 MED ORDER — MELOXICAM 15 MG PO TABS
15.0000 mg | ORAL_TABLET | Freq: Every day | ORAL | 0 refills | Status: AC | PRN
Start: 2024-01-19 — End: ?

## 2024-01-19 NOTE — Patient Instructions (Addendum)
 Injection today. Refill Meloxicam. Return in 4 weeks.

## 2024-02-15 NOTE — Progress Notes (Deleted)
 Desiree Rivera D.[name] [address] [place] [address] Phone: [phone]   Assessment and Plan:     There are no diagnoses linked to this encounter.  *** - Patient has received relief with OMT in the past.  Elects for repeat OMT today.  Tolerated well per note below. - Decision today to treat with OMT was based on Physical Exam   After verbal consent patient was treated with HVLA (high velocity low amplitude), ME (muscle energy), FPR (flex positional release), ST (soft tissue), PC/PD (Pelvic Compression/ Pelvic Decompression) techniques in cervical, rib, thoracic, lumbar, and pelvic areas. Patient tolerated the procedure well with improvement in symptoms.  Patient educated on potential side effects of soreness and recommended to rest, hydrate, and use Tylenol as needed for pain control.   Pertinent previous records reviewed include ***    Follow Up: ***     Subjective:   I, Desiree Rivera, am serving as a Neurosurgeon for Doctor Richardean Sale   Chief Complaint: rib pain    HPI:    10/29/2022 Patient is a 38 year old female complaining of rib pain. Patient states that it has been going on for 3 years pain , hx of torn labrum, former runner , pain from boob to knee, TTP ribcage around to the thoracic, floating rib pain that was adjusted by chiro didn't see a difference in pain , will get intermittent sharp pain through the rib cage makes her nausea at times, ib and tylenol has not helped , sometimes numbness and tingling when she moves a certain way    11/25/2022  Patient states ready for a tune up    12/09/2022 Patients states good , overall pain has gone down but still has pain when she wakes in the am and a little of pain in si joint but especially around the right rib cage , ribs TTP under her breast feels very "pressury and sore", top of hip is painful    12/24/2022 Patient states that she is good , right rib cage and outer hip are flared     01/20/2023 Patient states that she is good , she fell off on the meloxicam she forgot some days, overall she does feel good, has a lingering dull soreness in the SI join to the bottom rib cage , but outer hip feels good though   02/25/2023  Patient states SI joint is bothering her, neck and trap tightness on the right side with some rhomboids   03/25/2023 Patient states she is stressed,    04/28/2023 Patient states would like an injection . PT June 14th, did a stretch last night that helped with pain but pain came back when she woke up    05/26/2023 Patient states she is good , 4-5 days after injection she has not has any pain she is feeling good, decreased ROM neck and pain, pain right hip deep glute and hip     09/29/2023 Patient states ready for an adjustment. Wants to discuss PRP    10/18/2023 Patient states she is here for an adjustment right side rib cage. SI joint has been decent . Right side knee pain lateral    11/03/2023 Patient states she is feeling the best she has    11/25/2023 Patient states that she has good and bad days. Pain in R SI joint and R lumbar spine pain. Pain that radiates down to the knee and R ankle. Had some trouble sleeping twice since last visit.  Using meloxicam prn.    12/22/2023 Patient states that she is feeling okay but has some low back and hip tightness    01/19/2024 Patient states not as good as she wants to feel. Past two weeks: Pain radiating down the leg down to the ankle bone. Changed some exercises then stopped thinking that that caused the flare up but it is still present.  02/16/2024 Patient states   Relevant Historical Information: Right hip labral tear  Additional pertinent review of systems negative.  Current Outpatient Medications  Medication Sig Dispense Refill   baclofen (LIORESAL) 10 MG tablet Take 0.5-1 tablets (5-10 mg total) by mouth at bedtime as needed for muscle spasms. 30 each 3   celecoxib (CELEBREX) 100 MG capsule Take 2  capsules (200 mg total) by mouth daily. 60 capsule 1   meloxicam (MOBIC) 15 MG tablet Take 1 tablet (15 mg total) by mouth daily. 30 tablet 0   meloxicam (MOBIC) 15 MG tablet Take 1 tablet (15 mg total) by mouth daily as needed for pain. 30 tablet 0   methocarbamol (ROBAXIN) 500 MG tablet TAKE 1 TABLET BY MOUTH 4 TIMES DAILY. 30 tablet 4   MULTIPLE VITAMIN PO Take by mouth.     Norgestimate-Ethinyl Estradiol Triphasic (TRI-SPRINTEC) 0.18/0.215/0.25 MG-35 MCG tablet Take 1 tablet by mouth daily. 84 tablet 3   No current facility-administered medications for this visit.      Objective:     There were no vitals filed for this visit.    There is no height or weight on file to calculate BMI.    Physical Exam:     General: Well-appearing, cooperative, sitting comfortably in no acute distress.   OMT Physical Exam:  ASIS Compression Test: Positive Right Cervical: TTP paraspinal, *** Rib: Bilateral elevated first rib with TTP Thoracic: TTP paraspinal,*** Lumbar: TTP paraspinal,*** Pelvis: Right anterior innominate  Electronically signed by:  Desiree Rivera [name] 7:42 AM 02/15/24

## 2024-02-16 ENCOUNTER — Ambulatory Visit: Payer: BC Managed Care – PPO | Admitting: Sports Medicine

## 2024-02-23 NOTE — Progress Notes (Unsigned)
 [name] D.[name] [address] [place] [address] Phone: [phone]   Assessment and Plan:     There are no diagnoses linked to this encounter.  *** - Patient has received relief with OMT in the past.  Elects for repeat OMT today.  Tolerated well per note below. - Decision today to treat with OMT was based on Physical Exam   After verbal consent patient was treated with HVLA (high velocity low amplitude), ME (muscle energy), FPR (flex positional release), ST (soft tissue), PC/PD (Pelvic Compression/ Pelvic Decompression) techniques in cervical, rib, thoracic, lumbar, and pelvic areas. Patient tolerated the procedure well with improvement in symptoms.  Patient educated on potential side effects of soreness and recommended to rest, hydrate, and use Tylenol as needed for pain control.   Pertinent previous records reviewed include ***    Follow Up: ***     Subjective:   I,  , am serving as a Neurosurgeon for Doctor Richardean Sale   Chief Complaint: rib pain    HPI:    10/29/2022 Patient is a 38 year old female complaining of rib pain. Patient states that it has been going on for 3 years pain , hx of torn labrum, former runner , pain from boob to knee, TTP ribcage around to the thoracic, floating rib pain that was adjusted by chiro didn't see a difference in pain , will get intermittent sharp pain through the rib cage makes her nausea at times, ib and tylenol has not helped , sometimes numbness and tingling when she moves a certain way    11/25/2022  Patient states ready for a tune up    12/09/2022 Patients states good , overall pain has gone down but still has pain when she wakes in the am and a little of pain in si joint but especially around the right rib cage , ribs TTP under her breast feels very "pressury and sore", top of hip is painful    12/24/2022 Patient states that she is good , right rib cage and outer hip are flared     01/20/2023 Patient states that she is good , she fell off on the meloxicam she forgot some days, overall she does feel good, has a lingering dull soreness in the SI join to the bottom rib cage , but outer hip feels good though   02/25/2023  Patient states SI joint is bothering her, neck and trap tightness on the right side with some rhomboids   03/25/2023 Patient states she is stressed,    04/28/2023 Patient states would like an injection . PT June 14th, did a stretch last night that helped with pain but pain came back when she woke up    05/26/2023 Patient states she is good , 4-5 days after injection she has not has any pain she is feeling good, decreased ROM neck and pain, pain right hip deep glute and hip     09/29/2023 Patient states ready for an adjustment. Wants to discuss PRP    10/18/2023 Patient states she is here for an adjustment right side rib cage. SI joint has been decent . Right side knee pain lateral    11/03/2023 Patient states she is feeling the best she has    11/25/2023 Patient states that she has good and bad days. Pain in R SI joint and R lumbar spine pain. Pain that radiates down to the knee and R ankle. Had some trouble sleeping twice since last visit.  Using meloxicam prn.    12/22/2023 Patient states that she is feeling okay but has some low back and hip tightness    01/19/2024 Patient states not as good as she wants to feel. Past two weeks: Pain radiating down the leg down to the ankle bone. Changed some exercises then stopped thinking that that caused the flare up but it is still present.  02/24/2024 Patient states   Relevant Historical Information: Right hip labral tear  Additional pertinent review of systems negative.  Current Outpatient Medications  Medication Sig Dispense Refill   baclofen (LIORESAL) 10 MG tablet Take 0.5-1 tablets (5-10 mg total) by mouth at bedtime as needed for muscle spasms. 30 each 3   celecoxib (CELEBREX) 100 MG capsule Take 2  capsules (200 mg total) by mouth daily. 60 capsule 1   meloxicam (MOBIC) 15 MG tablet Take 1 tablet (15 mg total) by mouth daily. 30 tablet 0   meloxicam (MOBIC) 15 MG tablet Take 1 tablet (15 mg total) by mouth daily as needed for pain. 30 tablet 0   methocarbamol (ROBAXIN) 500 MG tablet TAKE 1 TABLET BY MOUTH 4 TIMES DAILY. 30 tablet 4   MULTIPLE VITAMIN PO Take by mouth.     Norgestimate-Ethinyl Estradiol Triphasic (TRI-SPRINTEC) 0.18/0.215/0.25 MG-35 MCG tablet Take 1 tablet by mouth daily. 84 tablet 3   No current facility-administered medications for this visit.      Objective:     There were no vitals filed for this visit.    There is no height or weight on file to calculate BMI.    Physical Exam:     General: Well-appearing, cooperative, sitting comfortably in no acute distress.   OMT Physical Exam:  ASIS Compression Test: Positive Right Cervical: TTP paraspinal, *** Rib: Bilateral elevated first rib with TTP Thoracic: TTP paraspinal,*** Lumbar: TTP paraspinal,*** Pelvis: Right anterior innominate  Electronically signed by:  [name] [name] 7:43 AM 02/23/24

## 2024-02-24 ENCOUNTER — Ambulatory Visit: Admitting: Sports Medicine

## 2024-02-24 VITALS — HR 64 | Ht 65.0 in | Wt 188.0 lb

## 2024-02-24 DIAGNOSIS — M26623 Arthralgia of bilateral temporomandibular joint: Secondary | ICD-10-CM | POA: Diagnosis not present

## 2024-02-24 DIAGNOSIS — M533 Sacrococcygeal disorders, not elsewhere classified: Secondary | ICD-10-CM | POA: Diagnosis not present

## 2024-02-24 DIAGNOSIS — M7061 Trochanteric bursitis, right hip: Secondary | ICD-10-CM

## 2024-02-24 DIAGNOSIS — M9905 Segmental and somatic dysfunction of pelvic region: Secondary | ICD-10-CM

## 2024-02-24 DIAGNOSIS — M9901 Segmental and somatic dysfunction of cervical region: Secondary | ICD-10-CM | POA: Diagnosis not present

## 2024-02-24 DIAGNOSIS — M9903 Segmental and somatic dysfunction of lumbar region: Secondary | ICD-10-CM | POA: Diagnosis not present

## 2024-02-24 DIAGNOSIS — G8929 Other chronic pain: Secondary | ICD-10-CM

## 2024-02-24 DIAGNOSIS — M9904 Segmental and somatic dysfunction of sacral region: Secondary | ICD-10-CM

## 2024-02-24 DIAGNOSIS — M9902 Segmental and somatic dysfunction of thoracic region: Secondary | ICD-10-CM

## 2024-03-22 NOTE — Progress Notes (Deleted)
 Ben Jackson D.[name] [address] [place] [address] Phone: [phone]   Assessment and Plan:     There are no diagnoses linked to this encounter.  *** - Patient has received relief with OMT in the past.  Elects for repeat OMT today.  Tolerated well per note below. - Decision today to treat with OMT was based on Physical Exam   After verbal consent patient was treated with HVLA (high velocity low amplitude), ME (muscle energy), FPR (flex positional release), ST (soft tissue), PC/PD (Pelvic Compression/ Pelvic Decompression) techniques in cervical, rib, thoracic, lumbar, and pelvic areas. Patient tolerated the procedure well with improvement in symptoms.  Patient educated on potential side effects of soreness and recommended to rest, hydrate, and use Tylenol as needed for pain control.   Pertinent previous records reviewed include ***    Follow Up: ***     Subjective:   I, Desiree Rivera, am serving as a Neurosurgeon for Doctor [name]   Chief Complaint: rib pain    HPI:    10/29/2022 Patient is a 38 year old female complaining of rib pain. Patient states that it has been going on for 3 years pain , hx of torn labrum, former runner , pain from boob to knee, TTP ribcage around to the thoracic, floating rib pain that was adjusted by chiro didn't see a difference in pain , will get intermittent sharp pain through the rib cage makes her nausea at times, ib and tylenol has not helped , sometimes numbness and tingling when she moves a certain way    11/25/2022  Patient states ready for a tune up    12/09/2022 Patients states good , overall pain has gone down but still has pain when she wakes in the am and a little of pain in si joint but especially around the right rib cage , ribs TTP under her breast feels very "pressury and sore", top of hip is painful    12/24/2022 Patient states that she is good , right rib cage and outer hip are flared     01/20/2023 Patient states that she is good , she fell off on the meloxicam  she forgot some days, overall she does feel good, has a lingering dull soreness in the SI join to the bottom rib cage , but outer hip feels good though   02/25/2023  Patient states SI joint is bothering her, neck and trap tightness on the right side with some rhomboids   03/25/2023 Patient states she is stressed,    04/28/2023 Patient states would like an injection . PT June 14th, did a stretch last night that helped with pain but pain came back when she woke up    05/26/2023 Patient states she is good , 4-5 days after injection she has not has any pain she is feeling good, decreased ROM neck and pain, pain right hip deep glute and hip     09/29/2023 Patient states ready for an adjustment. Wants to discuss PRP    10/18/2023 Patient states she is here for an adjustment right side rib cage. SI joint has been decent . Right side knee pain lateral    11/03/2023 Patient states she is feeling the best she has    11/25/2023 Patient states that she has good and bad days. Pain in R SI joint and R lumbar spine pain. Pain that radiates down to the knee and R ankle. Had some trouble sleeping twice since last visit.  Using meloxicam  prn.    12/22/2023 Patient states that she is feeling okay but has some low back and hip tightness    01/19/2024 Patient states not as good as she wants to feel. Past two weeks: Pain radiating down the leg down to the ankle bone. Changed some exercises then stopped thinking that that caused the flare up but it is still present.   02/24/2024 Patient states that she is doing really well, had some pain the last two days but added 10 min run but took a meloxicam  and that helped  03/23/2024 Patient states   Relevant Historical Information: Right hip labral tear  Additional pertinent review of systems negative.  Current Outpatient Medications  Medication Sig Dispense Refill   baclofen  (LIORESAL ) 10 MG  tablet Take 0.5-1 tablets (5-10 mg total) by mouth at bedtime as needed for muscle spasms. 30 each 3   celecoxib  (CELEBREX ) 100 MG capsule Take 2 capsules (200 mg total) by mouth daily. 60 capsule 1   meloxicam  (MOBIC ) 15 MG tablet Take 1 tablet (15 mg total) by mouth daily. 30 tablet 0   meloxicam  (MOBIC ) 15 MG tablet Take 1 tablet (15 mg total) by mouth daily as needed for pain. 30 tablet 0   methocarbamol  (ROBAXIN ) 500 MG tablet TAKE 1 TABLET BY MOUTH 4 TIMES DAILY. 30 tablet 4   MULTIPLE VITAMIN PO Take by mouth.     Norgestimate-Ethinyl Estradiol Triphasic (TRI-SPRINTEC) 0.18/0.215/0.25 MG-35 MCG tablet Take 1 tablet by mouth daily. 84 tablet 3   No current facility-administered medications for this visit.      Objective:     There were no vitals filed for this visit.    There is no height or weight on file to calculate BMI.    Physical Exam:     General: Well-appearing, cooperative, sitting comfortably in no acute distress.   OMT Physical Exam:  ASIS Compression Test: Positive Right Cervical: TTP paraspinal, *** Rib: Bilateral elevated first rib with TTP Thoracic: TTP paraspinal,*** Lumbar: TTP paraspinal,*** Pelvis: Right anterior innominate  Electronically signed by:  Marshall Skeeter D.[name] 7:51 AM 03/22/24

## 2024-03-23 ENCOUNTER — Ambulatory Visit: Admitting: Sports Medicine

## 2024-03-31 NOTE — Progress Notes (Unsigned)
 Desiree Rivera D[name] [address] [place] [address] Phone: [phone]   Assessment and Plan:     There are no diagnoses linked to this encounter.  *** - Patient has received relief with OMT in the past.  Elects for repeat OMT today.  Tolerated well per note below. - Decision today to treat with OMT was based on Physical Exam   After verbal consent patient was treated with HVLA (high velocity low amplitude), ME (muscle energy), FPR (flex positional release), ST (soft tissue), PC/PD (Pelvic Compression/ Pelvic Decompression) techniques in cervical, rib, thoracic, lumbar, and pelvic areas. Patient tolerated the procedure well with improvement in symptoms.  Patient educated on potential side effects of soreness and recommended to rest, hydrate, and use Tylenol as needed for pain control.   Pertinent previous records reviewed include ***    Follow Up: ***     Subjective:   I, Herb Beltre, am serving as a Neurosurgeon for Doctor [name]   Chief Complaint: rib pain    HPI:    10/29/2022 Patient is a 38 year old female complaining of rib pain. Patient states that it has been going on for 3 years pain , hx of torn labrum, former runner , pain from boob to knee, TTP ribcage around to the thoracic, floating rib pain that was adjusted by chiro didn't see a difference in pain , will get intermittent sharp pain through the rib cage makes her nausea at times, ib and tylenol has not helped , sometimes numbness and tingling when she moves a certain way    11/25/2022  Patient states ready for a tune up    12/09/2022 Patients states good , overall pain has gone down but still has pain when she wakes in the am and a little of pain in si joint but especially around the right rib cage , ribs TTP under her breast feels very "pressury and sore", top of hip is painful    12/24/2022 Patient states that she is good , right rib cage and outer hip are flared     01/20/2023 Patient states that she is good , she fell off on the meloxicam  she forgot some days, overall she does feel good, has a lingering dull soreness in the SI join to the bottom rib cage , but outer hip feels good though   02/25/2023  Patient states SI joint is bothering her, neck and trap tightness on the right side with some rhomboids   03/25/2023 Patient states she is stressed,    04/28/2023 Patient states would like an injection . PT June 14th, did a stretch last night that helped with pain but pain came back when she woke up    05/26/2023 Patient states she is good , 4-5 days after injection she has not has any pain she is feeling good, decreased ROM neck and pain, pain right hip deep glute and hip     09/29/2023 Patient states ready for an adjustment. Wants to discuss PRP    10/18/2023 Patient states she is here for an adjustment right side rib cage. SI joint has been decent . Right side knee pain lateral    11/03/2023 Patient states she is feeling the best she has    11/25/2023 Patient states that she has good and bad days. Pain in R SI joint and R lumbar spine pain. Pain that radiates down to the knee and R ankle. Had some trouble sleeping twice since last visit.  Using meloxicam  prn.    12/22/2023 Patient states that she is feeling okay but has some low back and hip tightness    01/19/2024 Patient states not as good as she wants to feel. Past two weeks: Pain radiating down the leg down to the ankle bone. Changed some exercises then stopped thinking that that caused the flare up but it is still present.   02/24/2024 Patient states that she is doing really well, had some pain the last two days but added 10 min run but took a meloxicam  and that helped  04/03/2024 Patient states   Relevant Historical Information: Right hip labral tear  Additional pertinent review of systems negative.  Current Outpatient Medications  Medication Sig Dispense Refill   baclofen  (LIORESAL ) 10 MG  tablet Take 0.5-1 tablets (5-10 mg total) by mouth at bedtime as needed for muscle spasms. 30 each 3   celecoxib  (CELEBREX ) 100 MG capsule Take 2 capsules (200 mg total) by mouth daily. 60 capsule 1   meloxicam  (MOBIC ) 15 MG tablet Take 1 tablet (15 mg total) by mouth daily. 30 tablet 0   meloxicam  (MOBIC ) 15 MG tablet Take 1 tablet (15 mg total) by mouth daily as needed for pain. 30 tablet 0   methocarbamol  (ROBAXIN ) 500 MG tablet TAKE 1 TABLET BY MOUTH 4 TIMES DAILY. 30 tablet 4   MULTIPLE VITAMIN PO Take by mouth.     Norgestimate-Ethinyl Estradiol Triphasic (TRI-SPRINTEC) 0.18/0.215/0.25 MG-35 MCG tablet Take 1 tablet by mouth daily. 84 tablet 3   No current facility-administered medications for this visit.      Objective:     There were no vitals filed for this visit.    There is no height or weight on file to calculate BMI.    Physical Exam:     General: Well-appearing, cooperative, sitting comfortably in no acute distress.   OMT Physical Exam:  ASIS Compression Test: Positive Right Cervical: TTP paraspinal, *** Rib: Bilateral elevated first rib with TTP Thoracic: TTP paraspinal,*** Lumbar: TTP paraspinal,*** Pelvis: Right anterior innominate  Electronically signed by:  Marshall Skeeter [name] 7:45 AM 03/31/24

## 2024-04-03 ENCOUNTER — Ambulatory Visit: Admitting: Sports Medicine

## 2024-04-03 VITALS — BP 122/70 | HR 65 | Ht 65.0 in | Wt 192.6 lb

## 2024-04-03 DIAGNOSIS — M546 Pain in thoracic spine: Secondary | ICD-10-CM | POA: Diagnosis not present

## 2024-04-03 DIAGNOSIS — M9903 Segmental and somatic dysfunction of lumbar region: Secondary | ICD-10-CM | POA: Diagnosis not present

## 2024-04-03 DIAGNOSIS — M9904 Segmental and somatic dysfunction of sacral region: Secondary | ICD-10-CM | POA: Diagnosis not present

## 2024-04-03 DIAGNOSIS — M9905 Segmental and somatic dysfunction of pelvic region: Secondary | ICD-10-CM

## 2024-04-03 DIAGNOSIS — M9901 Segmental and somatic dysfunction of cervical region: Secondary | ICD-10-CM | POA: Diagnosis not present

## 2024-04-03 DIAGNOSIS — M9902 Segmental and somatic dysfunction of thoracic region: Secondary | ICD-10-CM

## 2024-04-03 DIAGNOSIS — G8929 Other chronic pain: Secondary | ICD-10-CM

## 2024-05-03 NOTE — Progress Notes (Signed)
 Ben Margree Gimbel D.[name] [address] [place] [address] Phone: [phone]   Assessment and Plan:     1. Neck pain 2. Chronic bilateral thoracic back pain 3. Somatic dysfunction of lumbar region 4. Somatic dysfunction of pelvic region 5. Somatic dysfunction of sacral region 6. Somatic dysfunction of cervical region 7. Somatic dysfunction of thoracic region  -Chronic with exacerbation, subsequent visit - Recurrence of neck and upper back pain.  Overall significant improvement with regular OMT visits and continuing HEP - Continue Tylenol for day-to-day pain relief - Use meloxicam  15 mg daily as needed for pain.  Recommend limiting chronic NSAIDs to 1-2 doses per week to prevent long-term side effects. - Patient has received relief with OMT in the past.  Elects for repeat OMT today.  Tolerated well per note below. - Decision today to treat with OMT was based on Physical Exam   After verbal consent patient was treated with HVLA (high velocity low amplitude), ME (muscle energy), FPR (flex positional release), ST (soft tissue), PC/PD (Pelvic Compression/ Pelvic Decompression) techniques in cervical, rib, thoracic, lumbar, and pelvic areas. Patient tolerated the procedure well with improvement in symptoms.  Patient educated on potential side effects of soreness and recommended to rest, hydrate, and use Tylenol as needed for pain control.   Pertinent previous records reviewed include none  Follow Up: 4 weeks for reevaluation.  Could consider repeat OMT   Subjective:   I, Moenique Parris, am serving as a Neurosurgeon for Doctor [name]  Chief Complaint: rib pain    HPI:    10/29/2022 Patient is a 38 year old female complaining of rib pain. Patient states that it has been going on for 3 years pain , hx of torn labrum, former runner , pain from chest to knee, TTP ribcage around to the thoracic, floating rib pain that was adjusted by chiro didn't see a  difference in pain , will get intermittent sharp pain through the rib cage makes her nausea at times, ib and tylenol has not helped , sometimes numbness and tingling when she moves a certain way    11/25/2022  Patient states ready for a tune up    12/09/2022 Patients states good , overall pain has gone down but still has pain when she wakes in the am and a little of pain in si joint but especially around the right rib cage , ribs TTP under her breast feels very pressury and sore, top of hip is painful    12/24/2022 Patient states that she is good , right rib cage and outer hip are flared    01/20/2023 Patient states that she is good , she fell off on the meloxicam  she forgot some days, overall she does feel good, has a lingering dull soreness in the SI join to the bottom rib cage , but outer hip feels good though   02/25/2023  Patient states SI joint is bothering her, neck and trap tightness on the right side with some rhomboids   03/25/2023 Patient states she is stressed,    04/28/2023 Patient states would like an injection . PT June 14th, did a stretch last night that helped with pain but pain came back when she woke up    05/26/2023 Patient states she is good , 4-5 days after injection she has not has any pain she is feeling good, decreased ROM neck and pain, pain right hip deep glute and hip     09/29/2023 Patient states  ready for an adjustment. Wants to discuss PRP    10/18/2023 Patient states she is here for an adjustment right side rib cage. SI joint has been decent . Right side knee pain lateral    11/03/2023 Patient states she is feeling the best she has    11/25/2023 Patient states that she has good and bad days. Pain in R SI joint and R lumbar spine pain. Pain that radiates down to the knee and R ankle. Had some trouble sleeping twice since last visit. Using meloxicam  prn.    12/22/2023 Patient states that she is feeling okay but has some low back and hip tightness     01/19/2024 Patient states not as good as she wants to feel. Past two weeks: Pain radiating down the leg down to the ankle bone. Changed some exercises then stopped thinking that that caused the flare up but it is still present.   02/24/2024 Patient states that she is doing really well, had some pain the last two days but added 10 min run but took a meloxicam  and that helped   04/03/2024 Patient states today is more of a wellness check day. A little stiff today in the back but that is because she has not done her stretch routine yet today.   05/04/2024 Patient states is still wanting to continue OT for preventative measures     Relevant Historical Information: Right hip labral tear Additional pertinent review of systems negative.  Current Outpatient Medications  Medication Sig Dispense Refill   baclofen  (LIORESAL ) 10 MG tablet Take 0.5-1 tablets (5-10 mg total) by mouth at bedtime as needed for muscle spasms. 30 each 3   celecoxib  (CELEBREX ) 100 MG capsule Take 2 capsules (200 mg total) by mouth daily. 60 capsule 1   meloxicam  (MOBIC ) 15 MG tablet Take 1 tablet (15 mg total) by mouth daily. 30 tablet 0   meloxicam  (MOBIC ) 15 MG tablet Take 1 tablet (15 mg total) by mouth daily as needed for pain. 30 tablet 0   methocarbamol  (ROBAXIN ) 500 MG tablet TAKE 1 TABLET BY MOUTH 4 TIMES DAILY. 30 tablet 4   MULTIPLE VITAMIN PO Take by mouth.     Norgestimate-Ethinyl Estradiol Triphasic (TRI-SPRINTEC) 0.18/0.215/0.25 MG-35 MCG tablet Take 1 tablet by mouth daily. 84 tablet 3   No current facility-administered medications for this visit.      Objective:     Vitals:   05/04/24 1355  BP: 120/60  Pulse: 74  SpO2: 98%  Height: 5' 5 (1.651 m)      Body mass index is 32.05 kg/m.    Physical Exam:     General: Well-appearing, cooperative, sitting comfortably in no acute distress.   OMT Physical Exam:  ASIS Compression Test: Positive Right Cervical: TTP paraspinal, C3 RR SL Rib: Bilateral  elevated first rib with TTP, worse on left Thoracic: TTP paraspinal, T4-7 RRSL Lumbar: TTP paraspinal, L2 RLSL Pelvis: Right anterior innominate  Electronically signed by:  Marshall Skeeter [name] 2:20 PM 05/04/24

## 2024-05-04 ENCOUNTER — Ambulatory Visit: Admitting: Sports Medicine

## 2024-05-04 VITALS — BP 120/60 | HR 74 | Ht 65.0 in

## 2024-05-04 DIAGNOSIS — M542 Cervicalgia: Secondary | ICD-10-CM

## 2024-05-04 DIAGNOSIS — M546 Pain in thoracic spine: Secondary | ICD-10-CM | POA: Diagnosis not present

## 2024-05-04 DIAGNOSIS — M9902 Segmental and somatic dysfunction of thoracic region: Secondary | ICD-10-CM

## 2024-05-04 DIAGNOSIS — G8929 Other chronic pain: Secondary | ICD-10-CM

## 2024-05-04 DIAGNOSIS — M9905 Segmental and somatic dysfunction of pelvic region: Secondary | ICD-10-CM

## 2024-05-04 DIAGNOSIS — M9903 Segmental and somatic dysfunction of lumbar region: Secondary | ICD-10-CM

## 2024-05-04 DIAGNOSIS — M9904 Segmental and somatic dysfunction of sacral region: Secondary | ICD-10-CM

## 2024-05-04 DIAGNOSIS — M9901 Segmental and somatic dysfunction of cervical region: Secondary | ICD-10-CM

## 2024-05-04 NOTE — Patient Instructions (Signed)
Good to see you   Follow up in  

## 2024-06-13 NOTE — Progress Notes (Unsigned)
 [name] [address] [place] [address] Phone: [phone]   Assessment and Plan:     There are no diagnoses linked to this encounter.  *** - Patient has received relief with OMT in the past.  Elects for repeat OMT today.  Tolerated well per note below. - Decision today to treat with OMT was based on Physical Exam   After verbal consent patient was treated with HVLA (high velocity low amplitude), ME (muscle energy), FPR (flex positional release), ST (soft tissue), PC/PD (Pelvic Compression/ Pelvic Decompression) techniques in cervical, rib, thoracic, lumbar, and pelvic areas. Patient tolerated the procedure well with improvement in symptoms.  Patient educated on potential side effects of soreness and recommended to rest, hydrate, and use Tylenol as needed for pain control.   Pertinent previous records reviewed include ***    Follow Up: ***     Subjective:   I, Quayshaun Hubbert, am serving as a Neurosurgeon for Doctor Morene Mace  Chief Complaint: rib pain    HPI:    10/29/2022 Patient is a 38 year old female complaining of rib pain. Patient states that it has been going on for 3 years pain , hx of torn labrum, former runner , pain from chest to knee, TTP ribcage around to the thoracic, floating rib pain that was adjusted by chiro didn't see a difference in pain , will get intermittent sharp pain through the rib cage makes her nausea at times, ib and tylenol has not helped , sometimes numbness and tingling when she moves a certain way    11/25/2022  Patient states ready for a tune up    12/09/2022 Patients states good , overall pain has gone down but still has pain when she wakes in the am and a little of pain in si joint but especially around the right rib cage , ribs TTP under her breast feels very pressury and sore, top of hip is painful    12/24/2022 Patient states that she is good , right rib cage and outer hip are flared     01/20/2023 Patient states that she is good , she fell off on the meloxicam  she forgot some days, overall she does feel good, has a lingering dull soreness in the SI join to the bottom rib cage , but outer hip feels good though   02/25/2023  Patient states SI joint is bothering her, neck and trap tightness on the right side with some rhomboids   03/25/2023 Patient states she is stressed,    04/28/2023 Patient states would like an injection . PT June 14th, did a stretch last night that helped with pain but pain came back when she woke up    05/26/2023 Patient states she is good , 4-5 days after injection she has not has any pain she is feeling good, decreased ROM neck and pain, pain right hip deep glute and hip     09/29/2023 Patient states ready for an adjustment. Wants to discuss PRP    10/18/2023 Patient states she is here for an adjustment right side rib cage. SI joint has been decent . Right side knee pain lateral    11/03/2023 Patient states she is feeling the best she has    11/25/2023 Patient states that she has good and bad days. Pain in R SI joint and R lumbar spine pain. Pain that radiates down to the knee and R ankle. Had some trouble sleeping twice since last visit. Using  meloxicam  prn.    12/22/2023 Patient states that she is feeling okay but has some low back and hip tightness    01/19/2024 Patient states not as good as she wants to feel. Past two weeks: Pain radiating down the leg down to the ankle bone. Changed some exercises then stopped thinking that that caused the flare up but it is still present.   02/24/2024 Patient states that she is doing really well, had some pain the last two days but added 10 min run but took a meloxicam  and that helped   04/03/2024 Patient states today is more of a wellness check day. A little stiff today in the back but that is because she has not done her stretch routine yet today.    05/04/2024 Patient states is still wanting to continue OT for  preventative measures    06/14/2024 Patient states   Relevant Historical Information: Right hip labral tear  Additional pertinent review of systems negative.  Current Outpatient Medications  Medication Sig Dispense Refill   baclofen  (LIORESAL ) 10 MG tablet Take 0.5-1 tablets (5-10 mg total) by mouth at bedtime as needed for muscle spasms. 30 each 3   celecoxib  (CELEBREX ) 100 MG capsule Take 2 capsules (200 mg total) by mouth daily. 60 capsule 1   meloxicam  (MOBIC ) 15 MG tablet Take 1 tablet (15 mg total) by mouth daily. 30 tablet 0   meloxicam  (MOBIC ) 15 MG tablet Take 1 tablet (15 mg total) by mouth daily as needed for pain. 30 tablet 0   methocarbamol  (ROBAXIN ) 500 MG tablet TAKE 1 TABLET BY MOUTH 4 TIMES DAILY. 30 tablet 4   MULTIPLE VITAMIN PO Take by mouth.     Norgestimate-Ethinyl Estradiol Triphasic (TRI-SPRINTEC) 0.18/0.215/0.25 MG-35 MCG tablet Take 1 tablet by mouth daily. 84 tablet 3   No current facility-administered medications for this visit.      Objective:     There were no vitals filed for this visit.    There is no height or weight on file to calculate BMI.    Physical Exam:     General: Well-appearing, cooperative, sitting comfortably in no acute distress.   OMT Physical Exam:  ASIS Compression Test: Positive Right Cervical: TTP paraspinal, *** Rib: Bilateral elevated first rib with TTP Thoracic: TTP paraspinal,*** Lumbar: TTP paraspinal,*** Pelvis: Right anterior innominate  Electronically signed by:  Odis Mace [name] 7:39 AM 06/13/24

## 2024-06-14 ENCOUNTER — Ambulatory Visit: Admitting: Sports Medicine

## 2024-06-14 VITALS — HR 69 | Ht 65.0 in | Wt 186.0 lb

## 2024-06-14 DIAGNOSIS — M9903 Segmental and somatic dysfunction of lumbar region: Secondary | ICD-10-CM

## 2024-06-14 DIAGNOSIS — M9905 Segmental and somatic dysfunction of pelvic region: Secondary | ICD-10-CM

## 2024-06-14 DIAGNOSIS — G8929 Other chronic pain: Secondary | ICD-10-CM | POA: Diagnosis not present

## 2024-06-14 DIAGNOSIS — M9902 Segmental and somatic dysfunction of thoracic region: Secondary | ICD-10-CM | POA: Diagnosis not present

## 2024-06-14 DIAGNOSIS — M542 Cervicalgia: Secondary | ICD-10-CM | POA: Diagnosis not present

## 2024-06-14 DIAGNOSIS — M546 Pain in thoracic spine: Secondary | ICD-10-CM | POA: Diagnosis not present

## 2024-06-14 DIAGNOSIS — M26629 Arthralgia of temporomandibular joint, unspecified side: Secondary | ICD-10-CM

## 2024-06-14 DIAGNOSIS — M9904 Segmental and somatic dysfunction of sacral region: Secondary | ICD-10-CM

## 2024-06-14 DIAGNOSIS — M9901 Segmental and somatic dysfunction of cervical region: Secondary | ICD-10-CM

## 2024-06-14 DIAGNOSIS — M9908 Segmental and somatic dysfunction of rib cage: Secondary | ICD-10-CM

## 2024-06-14 NOTE — Patient Instructions (Signed)
 TM[facility] HEP  4 week follow up

## 2024-07-12 ENCOUNTER — Ambulatory Visit: Admitting: Sports Medicine

## 2024-07-26 NOTE — Progress Notes (Unsigned)
 [facility] [address] [place] [address] Phone: [phone]   Assessment and Plan:     1. Neck pain (Primary) 2. Chronic bilateral thoracic back pain 3. Somatic dysfunction of cervical region 4. Somatic dysfunction of thoracic region 5. Somatic dysfunction of lumbar region 6. Somatic dysfunction of pelvic region 7. Somatic dysfunction of rib region -Chronic with exacerbation, subsequent visit - Recurrence of multiple areas of musculoskeletal pain with most prominent being neck, right lower back with right sided radicular symptoms, right knee - Use Tylenol 500 to 1000 mg tablets 2-3 times a day for day-to-day pain relief - Start meloxicam  15 mg daily x2 weeks.  If still having pain after 2 weeks, complete 3rd-week of NSAID. May use remaining NSAID as needed once daily for pain control.  Do not to use additional over-the-counter NSAIDs (ibuprofen, naproxen, Advil, Aleve, etc.) while taking prescription NSAIDs.  May use Tylenol [phone] mg 2 to 3 times a day for breakthrough pain.  - Patient has received relief with OMT in the past.  Elects for repeat OMT today.  Tolerated well per note below. - Decision today to treat with OMT was based on Physical Exam   After verbal consent patient was treated with HVLA (high velocity low amplitude), ME (muscle energy), FPR (flex positional release), ST (soft tissue), PC/PD (Pelvic Compression/ Pelvic Decompression) techniques in cervical, rib, thoracic, lumbar, and pelvic areas. Patient tolerated the procedure well with improvement in symptoms.  Patient educated on potential side effects of soreness and recommended to rest, hydrate, and use Tylenol as needed for pain control.   Pertinent previous records reviewed include none  Follow Up: 4 weeks for reevaluation.  Could consider repeat OMT.  If continued radicular symptoms, could discuss repeat right-sided SI joint CSI versus advanced imaging of lumbar  spine   Subjective:   I, Cortland Crehan, am serving as a Neurosurgeon for Doctor Morene Mace  Chief Complaint: rib pain    HPI:    10/29/2022 Patient is a 38 year old female complaining of rib pain. Patient states that it has been going on for 3 years pain , hx of torn labrum, former runner , pain from chest to knee, TTP ribcage around to the thoracic, floating rib pain that was adjusted by chiro didn't see a difference in pain , will get intermittent sharp pain through the rib cage makes her nausea at times, ib and tylenol has not helped , sometimes numbness and tingling when she moves a certain way    11/25/2022  Patient states ready for a tune up    12/09/2022 Patients states good , overall pain has gone down but still has pain when she wakes in the am and a little of pain in si joint but especially around the right rib cage , ribs TTP under her breast feels very pressury and sore, top of hip is painful    12/24/2022 Patient states that she is good , right rib cage and outer hip are flared    01/20/2023 Patient states that she is good , she fell off on the meloxicam  she forgot some days, overall she does feel good, has a lingering dull soreness in the SI join to the bottom rib cage , but outer hip feels good though   02/25/2023  Patient states SI joint is bothering her, neck and trap tightness on the right side with some rhomboids   03/25/2023 Patient states she is stressed,    04/28/2023  Patient states would like an injection . PT June 14th, did a stretch last night that helped with pain but pain came back when she woke up    05/26/2023 Patient states she is good , 4-5 days after injection she has not has any pain she is feeling good, decreased ROM neck and pain, pain right hip deep glute and hip     09/29/2023 Patient states ready for an adjustment. Wants to discuss PRP    10/18/2023 Patient states she is here for an adjustment right side rib cage. SI joint has been decent . Right side  knee pain lateral    11/03/2023 Patient states she is feeling the best she has    11/25/2023 Patient states that she has good and bad days. Pain in R SI joint and R lumbar spine pain. Pain that radiates down to the knee and R ankle. Had some trouble sleeping twice since last visit. Using meloxicam  prn.    12/22/2023 Patient states that she is feeling okay but has some low back and hip tightness    01/19/2024 Patient states not as good as she wants to feel. Past two weeks: Pain radiating down the leg down to the ankle bone. Changed some exercises then stopped thinking that that caused the flare up but it is still present.   02/24/2024 Patient states that she is doing really well, had some pain the last two days but added 10 min run but took a meloxicam  and that helped   04/03/2024 Patient states today is more of a wellness check day. A little stiff today in the back but that is because she has not done her stretch routine yet today.    05/04/2024 Patient states is still wanting to continue OT for preventative measures     06/14/2024 Patient states she is good ready for adjustment   07/27/2024 Patient states low back pain that radiated down her leg    Relevant Historical Information: Right hip labral tear    Additional pertinent review of systems negative.  Current Outpatient Medications  Medication Sig Dispense Refill   meloxicam  (MOBIC ) 15 MG tablet Take 1 tablet (15 mg total) by mouth daily. 30 tablet 0   baclofen  (LIORESAL ) 10 MG tablet Take 0.5-1 tablets (5-10 mg total) by mouth at bedtime as needed for muscle spasms. 30 each 3   celecoxib  (CELEBREX ) 100 MG capsule Take 2 capsules (200 mg total) by mouth daily. 60 capsule 1   meloxicam  (MOBIC ) 15 MG tablet Take 1 tablet (15 mg total) by mouth daily. 30 tablet 0   meloxicam  (MOBIC ) 15 MG tablet Take 1 tablet (15 mg total) by mouth daily as needed for pain. 30 tablet 0   methocarbamol  (ROBAXIN ) 500 MG tablet TAKE 1 TABLET BY MOUTH 4  TIMES DAILY. 30 tablet 4   MULTIPLE VITAMIN PO Take by mouth.     Norgestimate-Ethinyl Estradiol Triphasic (TRI-SPRINTEC) 0.18/0.215/0.25 MG-35 MCG tablet Take 1 tablet by mouth daily. 84 tablet 3   No current facility-administered medications for this visit.      Objective:     Vitals:   07/27/24 1423  Pulse: 80  SpO2: 98%  Weight: 185 lb (83.9 kg)  Height: 5' 5 (1.651 m)      Body mass index is 30.79 kg/m.    Physical Exam:     General: Well-appearing, cooperative, sitting comfortably in no acute distress.   OMT Physical Exam:  ASIS Compression Test: Positive Right Cervical: TTP paraspinal, C3-5 RRSR Rib: Bilateral  elevated first rib with TTP Thoracic: TTP paraspinal, T4-6 RRSL, T7-9 RLSR Lumbar: TTP paraspinal, L2 RLSL Pelvis: Right anterior innominate  Electronically signed by:  Odis Mace [name] 3:09 PM 07/27/24

## 2024-07-27 ENCOUNTER — Ambulatory Visit (INDEPENDENT_AMBULATORY_CARE_PROVIDER_SITE_OTHER): Admitting: Sports Medicine

## 2024-07-27 VITALS — HR 80 | Ht 65.0 in | Wt 185.0 lb

## 2024-07-27 DIAGNOSIS — M542 Cervicalgia: Secondary | ICD-10-CM | POA: Diagnosis not present

## 2024-07-27 DIAGNOSIS — M9903 Segmental and somatic dysfunction of lumbar region: Secondary | ICD-10-CM

## 2024-07-27 DIAGNOSIS — G8929 Other chronic pain: Secondary | ICD-10-CM

## 2024-07-27 DIAGNOSIS — M9901 Segmental and somatic dysfunction of cervical region: Secondary | ICD-10-CM

## 2024-07-27 DIAGNOSIS — M546 Pain in thoracic spine: Secondary | ICD-10-CM | POA: Diagnosis not present

## 2024-07-27 DIAGNOSIS — M9908 Segmental and somatic dysfunction of rib cage: Secondary | ICD-10-CM

## 2024-07-27 DIAGNOSIS — M9902 Segmental and somatic dysfunction of thoracic region: Secondary | ICD-10-CM

## 2024-07-27 DIAGNOSIS — M9905 Segmental and somatic dysfunction of pelvic region: Secondary | ICD-10-CM

## 2024-07-27 MED ORDER — MELOXICAM 15 MG PO TABS: 15.0000 mg | ORAL_TABLET | Freq: Every day | ORAL | 0 refills | Status: DC

## 2024-07-27 NOTE — Patient Instructions (Signed)
-   Start meloxicam 15 mg daily x2 weeks.  If still having pain after 2 weeks, complete 3rd-week of NSAID. May use remaining NSAID as needed once daily for pain control.  Do not to use additional over-the-counter NSAIDs (ibuprofen, naproxen, Advil, Aleve, etc.) while taking prescription NSAIDs.  May use Tylenol [phone] mg 2 to 3 times a day for breakthrough pain. 4 week follow up

## 2024-08-02 ENCOUNTER — Other Ambulatory Visit: Payer: Self-pay | Admitting: Sports Medicine

## 2024-08-02 ENCOUNTER — Other Ambulatory Visit: Payer: Self-pay | Admitting: Licensed Practical Nurse

## 2024-08-02 DIAGNOSIS — Z3041 Encounter for surveillance of contraceptive pills: Secondary | ICD-10-CM

## 2024-08-02 DIAGNOSIS — Z01419 Encounter for gynecological examination (general) (routine) without abnormal findings: Secondary | ICD-10-CM

## 2024-08-03 ENCOUNTER — Other Ambulatory Visit: Payer: Self-pay

## 2024-08-03 DIAGNOSIS — Z01419 Encounter for gynecological examination (general) (routine) without abnormal findings: Secondary | ICD-10-CM

## 2024-08-03 DIAGNOSIS — Z3041 Encounter for surveillance of contraceptive pills: Secondary | ICD-10-CM

## 2024-08-03 MED ORDER — NORGESTIM-ETH ESTRAD TRIPHASIC 0.18/0.215/0.25 MG-35 MCG PO TABS: 1.0000 | ORAL_TABLET | Freq: Every day | ORAL | 0 refills | Status: DC

## 2024-08-03 NOTE — Telephone Encounter (Signed)
 Last OV 07/27/24 Next OV 08/22/24  Last refill 07/27/24 Qty #30  Renal labs 06/01/23  Component Ref Range & Units (hover) 1 yr ago 2 yr ago  Glucose 100 High  89  BUN 10 10  Creatinine, Ser 0.77 0.78  eGFR 103 102  BUN/Creatinine Ratio 13 13  Sodium 138 139  Potassium 4.5 4.0  Chloride 102 101  CO2 24 25  Calcium 9.6 9.5  Total Protein 6.6 6.8  Albumin 4.5 4.5 R  Globulin, Total 2.1 2.3  Bilirubin Total 0.3 0.2  Alkaline Phosphatase 64 69  AST 18 20  ALT 12 8

## 2024-08-03 NOTE — Progress Notes (Signed)
 Rx printed instead of faxed.  Changed to fax.  Patient will need annual for further refills.

## 2024-08-03 NOTE — Telephone Encounter (Signed)
 Patient will need annual for further refills.

## 2024-08-21 NOTE — Progress Notes (Unsigned)
 Desiree Rivera   Assessment and Plan:     1. Chronic right SI joint pain 2. Right hip pain (Primary) 3. Somatic dysfunction of cervical region 4. Somatic dysfunction of thoracic region 5. Somatic dysfunction of lumbar region 6. Somatic dysfunction of pelvic region 7. Somatic dysfunction of rib region 8. Right leg pain -Chronic with exacerbation, subsequent visit - Recurrence in right leg pain with pain currently in right posterior hip, right lateral hip, right lateral thigh, right lateral distal leg.  Unclear if pain is related to compensatory mechanisms with recurrent flare of right SI joint pain and greater trochanteric bursitis or if this represents a new lumbar radiculopathy - Use meloxicam  15 mg daily as needed for breakthrough pain.  Recommend limiting chronic NSAIDs to 1-2 doses per week to prevent long-term side effects. Use Tylenol 500 to 1000 mg tablets 2-3 times a day as needed for day-to-day pain relief.    - Start prednisone  Dosepak - Continue HEP and physical activity as tolerated - Patient has received relief with OMT in the past.  Elects for repeat OMT today.  Tolerated well per note below. - Decision today to treat with OMT was based on Physical Exam   After verbal consent patient was treated with HVLA (high velocity low amplitude), ME (muscle energy), FPR (flex positional release), ST (soft tissue), PC/PD (Pelvic Compression/ Pelvic Decompression) techniques in cervical, rib, thoracic, lumbar, and pelvic areas. Patient tolerated the procedure well with improvement in symptoms.  Patient educated on potential side effects of soreness and recommended to rest, hydrate, and use Tylenol as needed for pain control.   Pertinent previous records reviewed include none  Follow Up: 2 weeks for reevaluation.  If no improvement or worsening of symptoms, would consider CSI to areas of remaining  pain such as right SI joint versus piriformis versus greater trochanteric bursa.  Could consider lumbar advanced imaging   Subjective:   I, Desiree Rivera, am serving as a Neurosurgeon for Doctor Desiree Rivera  Chief Complaint: rib pain    HPI:    10/29/2022 Patient is a 38 year old female complaining of rib pain. Patient states that it has been going on for 3 years pain , hx of torn labrum, former runner , pain from chest to knee, TTP ribcage around to the thoracic, floating rib pain that was adjusted by chiro didn't see a difference in pain , will get intermittent sharp pain through the rib cage makes her nausea at times, ib and tylenol has not helped , sometimes numbness and tingling when she moves a certain way    11/25/2022  Patient states ready for a tune up    12/09/2022 Patients states good , overall pain has gone down but still has pain when she wakes in the am and a little of pain in si joint but especially around the right rib cage , ribs TTP under her breast feels very pressury and sore, top of hip is painful    12/24/2022 Patient states that she is good , right rib cage and outer hip are flared    01/20/2023 Patient states that she is good , she fell off on the meloxicam  she forgot some days, overall she does feel good, has a lingering dull soreness in the SI join to the bottom rib cage , but outer hip feels good though   02/25/2023  Patient states SI joint is bothering her, neck  and trap tightness on the right side with some rhomboids   03/25/2023 Patient states she is stressed,    04/28/2023 Patient states would like an injection . PT June 14th, did a stretch last night that helped with pain but pain came back when she woke up    05/26/2023 Patient states she is good , 4-5 days after injection she has not has any pain she is feeling good, decreased ROM neck and pain, pain right hip deep glute and hip     09/29/2023 Patient states ready for an adjustment. Wants to discuss PRP     10/18/2023 Patient states she is here for an adjustment right side rib cage. SI joint has been decent . Right side knee pain lateral    11/03/2023 Patient states she is feeling the best she has    11/25/2023 Patient states that she has good and bad days. Pain in R SI joint and R lumbar spine pain. Pain that radiates down to the knee and R ankle. Had some trouble sleeping twice since last visit. Using meloxicam  prn.    12/22/2023 Patient states that she is feeling okay but has some low back and hip tightness    01/19/2024 Patient states not as good as she wants to feel. Past two weeks: Pain radiating down the leg down to the ankle bone. Changed some exercises then stopped thinking that that caused the flare up but it is still present.   02/24/2024 Patient states that she is doing really well, had some pain the last two days but added 10 min run but took a meloxicam  and that helped   04/03/2024 Patient states today is more of a wellness check day. A little stiff today in the back but that is because she has not done her stretch routine yet today.    05/04/2024 Patient states is still wanting to continue OT for preventative measures     06/14/2024 Patient states she is good ready for adjustment    07/27/2024 Patient states low back pain that radiated down her leg   08/22/2024 Patient states right hip pain down to ankle still painful. Meloxicam  did not help. Ibu helped more.    Relevant Historical Information: Right hip labral tear    Additional pertinent review of systems negative.  Current Outpatient Medications  Medication Sig Dispense Refill   baclofen  (LIORESAL ) 10 MG tablet Take 0.5-1 tablets (5-10 mg total) by mouth at bedtime as needed for muscle spasms. 30 each 3   celecoxib  (CELEBREX ) 100 MG capsule Take 2 capsules (200 mg total) by mouth daily. 60 capsule 1   meloxicam  (MOBIC ) 15 MG tablet Take 1 tablet (15 mg total) by mouth daily. 30 tablet 0   meloxicam  (MOBIC ) 15 MG tablet  Take 1 tablet (15 mg total) by mouth daily as needed for pain. 30 tablet 0   meloxicam  (MOBIC ) 15 MG tablet TAKE 1 TABLET (15 MG TOTAL) BY MOUTH DAILY. 30 tablet 0   methocarbamol  (ROBAXIN ) 500 MG tablet TAKE 1 TABLET BY MOUTH 4 TIMES DAILY. 30 tablet 4   MULTIPLE VITAMIN PO Take by mouth.     Norgestimate-Ethinyl Estradiol Triphasic (TRI-ESTARYLLA) 0.18/0.215/0.25 MG-35 MCG tablet Take 1 tablet by mouth daily. 84 tablet 0   No current facility-administered medications for this visit.      Objective:     Vitals:   08/22/24 1421  Pulse: 67  SpO2: 97%  Weight: 190 lb (86.2 kg)  Height: 5' 5 (1.651 m)  Body mass index is 31.62 kg/m.    Physical Exam:     General: Well-appearing, cooperative, sitting comfortably in no acute distress.   OMT Physical Exam:  ASIS Compression Test: Positive Right Cervical: TTP paraspinal, C3-5 RR SR Rib: Bilateral elevated first rib with TTP Thoracic: TTP paraspinal, T3-5 RRSL, T6-8 RLSR Lumbar: TTP paraspinal, L1-3 RRSL Pelvis: Right anterior innominate  Electronically signed by:  Odis Rivera D.[name] 2:48 PM 08/22/24

## 2024-08-22 ENCOUNTER — Ambulatory Visit: Admitting: Sports Medicine

## 2024-08-22 VITALS — HR 67 | Ht 65.0 in | Wt 190.0 lb

## 2024-08-22 DIAGNOSIS — M9905 Segmental and somatic dysfunction of pelvic region: Secondary | ICD-10-CM

## 2024-08-22 DIAGNOSIS — M79604 Pain in right leg: Secondary | ICD-10-CM | POA: Diagnosis not present

## 2024-08-22 DIAGNOSIS — M9901 Segmental and somatic dysfunction of cervical region: Secondary | ICD-10-CM | POA: Diagnosis not present

## 2024-08-22 DIAGNOSIS — M533 Sacrococcygeal disorders, not elsewhere classified: Secondary | ICD-10-CM

## 2024-08-22 DIAGNOSIS — G8929 Other chronic pain: Secondary | ICD-10-CM

## 2024-08-22 DIAGNOSIS — M9903 Segmental and somatic dysfunction of lumbar region: Secondary | ICD-10-CM | POA: Diagnosis not present

## 2024-08-22 DIAGNOSIS — M25551 Pain in right hip: Secondary | ICD-10-CM | POA: Diagnosis not present

## 2024-08-22 DIAGNOSIS — M9902 Segmental and somatic dysfunction of thoracic region: Secondary | ICD-10-CM | POA: Diagnosis not present

## 2024-08-22 DIAGNOSIS — M542 Cervicalgia: Secondary | ICD-10-CM

## 2024-08-22 DIAGNOSIS — M9908 Segmental and somatic dysfunction of rib cage: Secondary | ICD-10-CM

## 2024-08-22 DIAGNOSIS — M546 Pain in thoracic spine: Secondary | ICD-10-CM | POA: Diagnosis not present

## 2024-08-22 MED ORDER — METHYLPREDNISOLONE 4 MG PO TBPK: ORAL_TABLET | ORAL | 0 refills | Status: DC

## 2024-09-06 NOTE — Progress Notes (Unsigned)
 Desiree Jackson D.CLEMENTEEN Rivera [name] [address] [place] [address] Phone: [phone]   Assessment and Plan:     1. Chronic right SI joint pain (Primary) 2. Right hip pain 3. Chronic bilateral low back pain with right-sided sciatica -Chronic with exacerbation, subsequent visit - Continued flare of multiple early areas of musculoskeletal pain including right lower back, right hip, and radicular symptoms into right lower extremity.  Concerning for lumbar radiculopathy.  Benefit with prednisone  course, however symptoms have returned since completing prednisone  course - Recommend further evaluation with lumbar MRI based on relatively unremarkable previous x-ray imaging, failure to improve despite >6 weeks of conservative therapy, pain with day-to-day activities, pain at times >6/10 - Use meloxicam  15 mg daily as needed for breakthrough pain.  Recommend limiting chronic NSAIDs to 1-2 doses per week to prevent long-term side effects. Use Tylenol 500 to 1000 mg tablets 2-3 times a day as needed for day-to-day pain relief.    - Continue HEP and physical activity as tolerated    Pertinent previous records reviewed include lumbar x-ray 2021   Follow Up: 1 week after MRI to review results and discuss treatment plan.  Could consider repeat OMT versus repeat SI joint CSI versus epidural based on imaging   Subjective:   I, Nasira Janusz, am serving as a Neurosurgeon for Doctor Morene Mace  Chief Complaint: rib pain    HPI:    10/29/2022 Patient is a 38 year old female complaining of rib pain. Patient states that it has been going on for 3 years pain , hx of torn labrum, former runner , pain from chest to knee, TTP ribcage around to the thoracic, floating rib pain that was adjusted by chiro didn't see a difference in pain , will get intermittent sharp pain through the rib cage makes her nausea at times, ib and tylenol has not helped , sometimes numbness and tingling  when she moves a certain way    11/25/2022  Patient states ready for a tune up    12/09/2022 Patients states good , overall pain has gone down but still has pain when she wakes in the am and a little of pain in si joint but especially around the right rib cage , ribs TTP under her breast feels very pressury and sore, top of hip is painful    12/24/2022 Patient states that she is good , right rib cage and outer hip are flared    01/20/2023 Patient states that she is good , she fell off on the meloxicam  she forgot some days, overall she does feel good, has a lingering dull soreness in the SI join to the bottom rib cage , but outer hip feels good though   02/25/2023  Patient states SI joint is bothering her, neck and trap tightness on the right side with some rhomboids   03/25/2023 Patient states she is stressed,    04/28/2023 Patient states would like an injection . PT June 14th, did a stretch last night that helped with pain but pain came back when she woke up    05/26/2023 Patient states she is good , 4-5 days after injection she has not has any pain she is feeling good, decreased ROM neck and pain, pain right hip deep glute and hip     09/29/2023 Patient states ready for an adjustment. Wants to discuss PRP    10/18/2023 Patient states she is here for an adjustment right side rib cage. SI joint  has been decent . Right side knee pain lateral    11/03/2023 Patient states she is feeling the best she has    11/25/2023 Patient states that she has good and bad days. Pain in R SI joint and R lumbar spine pain. Pain that radiates down to the knee and R ankle. Had some trouble sleeping twice since last visit. Using meloxicam  prn.    12/22/2023 Patient states that she is feeling okay but has some low back and hip tightness    01/19/2024 Patient states not as good as she wants to feel. Past two weeks: Pain radiating down the leg down to the ankle bone. Changed some exercises then stopped thinking that  that caused the flare up but it is still present.   02/24/2024 Patient states that she is doing really well, had some pain the last two days but added 10 min run but took a meloxicam  and that helped   04/03/2024 Patient states today is more of a wellness check day. A little stiff today in the back but that is because she has not done her stretch routine yet today.    05/04/2024 Patient states is still wanting to continue OT for preventative measures     06/14/2024 Patient states she is good ready for adjustment    07/27/2024 Patient states low back pain that radiated down her leg    08/22/2024 Patient states right hip pain down to ankle still painful. Meloxicam  did not help. Ibu helped more.   09/07/2024 Patient states prednisone  really helped. Pain came back Sunday. Low back that radiates down the legs. Lingering soreness   Relevant Historical Information: Right hip labral tear  Additional pertinent review of systems negative.   Current Outpatient Medications:    baclofen  (LIORESAL ) 10 MG tablet, Take 0.5-1 tablets (5-10 mg total) by mouth at bedtime as needed for muscle spasms., Disp: 30 each, Rfl: 3   celecoxib  (CELEBREX ) 100 MG capsule, Take 2 capsules (200 mg total) by mouth daily., Disp: 60 capsule, Rfl: 1   meloxicam  (MOBIC ) 15 MG tablet, Take 1 tablet (15 mg total) by mouth daily., Disp: 30 tablet, Rfl: 0   meloxicam  (MOBIC ) 15 MG tablet, Take 1 tablet (15 mg total) by mouth daily as needed for pain., Disp: 30 tablet, Rfl: 0   meloxicam  (MOBIC ) 15 MG tablet, TAKE 1 TABLET (15 MG TOTAL) BY MOUTH DAILY., Disp: 30 tablet, Rfl: 0   methocarbamol  (ROBAXIN ) 500 MG tablet, TAKE 1 TABLET BY MOUTH 4 TIMES DAILY., Disp: 30 tablet, Rfl: 4   methylPREDNISolone (MEDROL DOSEPAK) 4 MG TBPK tablet, Day 1:  (2) tablets before breakfast, (1) after lunch, (1) after dinner, (2) at bedtime [24-mg] Day 2:  (1) before breakfast, (1) after lunch, (1) after dinner, (2) at bedtime [20-mg] Day 3:  (1) before  breakfast, (1) after lunch, (1) after dinner, (1) at bedtime [16-mg] Day 4:  (1) before breakfast, (1) after lunch, (1) at bedtime [12-mg] Day 5:  (1) before breakfast, (1) at bedtime [8-mg] Day 6:  (1) before breakfast [4-mg], Normal, Disp: 21 tablet, Rfl: 0   MULTIPLE VITAMIN PO, Take by mouth., Disp: , Rfl:    Norgestimate-Ethinyl Estradiol Triphasic (TRI-ESTARYLLA) 0.18/0.215/0.25 MG-35 MCG tablet, Take 1 tablet by mouth daily., Disp: 84 tablet, Rfl: 0   Objective:     Vitals:   09/07/24 1059  Pulse: 68  SpO2: 98%  Weight: 187 lb (84.8 kg)  Height: 5' 5 (1.651 m)      Body mass index  is 31.12 kg/m.    Physical Exam:    Gen: Appears well, nad, nontoxic and pleasant Psych: Alert and oriented, appropriate mood and affect Neuro: sensation intact, strength is 5/5 in upper and lower extremities, muscle tone wnl Skin: no susupicious lesions or rashes  Back - Normal skin, Spine with normal alignment and no deformity.   No tenderness to vertebral process palpation.   Lumbar paraspinous muscles are   tender and without spasm, worse on right  TTP right gluteal musculature, SI joint Straight leg raise positive right Trendelenberg negative Piriformis Test negative Gait normal    Electronically signed by:  Odis Mace D.CLEMENTEEN [name] 11:13 AM 09/07/24

## 2024-09-07 ENCOUNTER — Ambulatory Visit: Admitting: Sports Medicine

## 2024-09-07 VITALS — HR 68 | Ht 65.0 in | Wt 187.0 lb

## 2024-09-07 DIAGNOSIS — G8929 Other chronic pain: Secondary | ICD-10-CM

## 2024-09-07 DIAGNOSIS — M533 Sacrococcygeal disorders, not elsewhere classified: Secondary | ICD-10-CM

## 2024-09-07 DIAGNOSIS — M5441 Lumbago with sciatica, right side: Secondary | ICD-10-CM

## 2024-09-07 DIAGNOSIS — M25551 Pain in right hip: Secondary | ICD-10-CM | POA: Diagnosis not present

## 2024-09-07 NOTE — Patient Instructions (Signed)
 Mri referral   Tylenol [phone] mg 2-3 times a day for pain relief   Physical activity as tolerated   Follow up 7 days after to discuss results

## 2024-09-14 ENCOUNTER — Other Ambulatory Visit

## 2024-10-03 NOTE — Progress Notes (Deleted)
 [name] [address] [place] [address] Phone: [phone]   Assessment and Plan:     ***    Pertinent previous records reviewed include ***   Follow Up: ***     Subjective:   I, Desiree Rivera, am serving as a neurosurgeon for Doctor Morene Mace  Chief Complaint: rib pain    HPI:    10/29/2022 Patient is a 38 year old female complaining of rib pain. Patient states that it has been going on for 3 years pain , hx of torn labrum, former runner , pain from chest to knee, TTP ribcage around to the thoracic, floating rib pain that was adjusted by chiro didn't see a difference in pain , will get intermittent sharp pain through the rib cage makes her nausea at times, ib and tylenol has not helped , sometimes numbness and tingling when she moves a certain way    11/25/2022  Patient states ready for a tune up    12/09/2022 Patients states good , overall pain has gone down but still has pain when she wakes in the am and a little of pain in si joint but especially around the right rib cage , ribs TTP under her breast feels very pressury and sore, top of hip is painful    12/24/2022 Patient states that she is good , right rib cage and outer hip are flared    01/20/2023 Patient states that she is good , she fell off on the meloxicam  she forgot some days, overall she does feel good, has a lingering dull soreness in the SI join to the bottom rib cage , but outer hip feels good though   02/25/2023  Patient states SI joint is bothering her, neck and trap tightness on the right side with some rhomboids   03/25/2023 Patient states she is stressed,    04/28/2023 Patient states would like an injection . PT June 14th, did a stretch last night that helped with pain but pain came back when she woke up    05/26/2023 Patient states she is good , 4-5 days after injection she has not has any pain she is feeling good, decreased ROM neck and pain,  pain right hip deep glute and hip     09/29/2023 Patient states ready for an adjustment. Wants to discuss PRP    10/18/2023 Patient states she is here for an adjustment right side rib cage. SI joint has been decent . Right side knee pain lateral    11/03/2023 Patient states she is feeling the best she has    11/25/2023 Patient states that she has good and bad days. Pain in R SI joint and R lumbar spine pain. Pain that radiates down to the knee and R ankle. Had some trouble sleeping twice since last visit. Using meloxicam  prn.    12/22/2023 Patient states that she is feeling okay but has some low back and hip tightness    01/19/2024 Patient states not as good as she wants to feel. Past two weeks: Pain radiating down the leg down to the ankle bone. Changed some exercises then stopped thinking that that caused the flare up but it is still present.   02/24/2024 Patient states that she is doing really well, had some pain the last two days but added 10 min run but took a meloxicam  and that helped   04/03/2024 Patient states today is more of a wellness check day. A little stiff  today in the back but that is because she has not done her stretch routine yet today.    05/04/2024 Patient states is still wanting to continue OT for preventative measures     06/14/2024 Patient states she is good ready for adjustment    07/27/2024 Patient states low back pain that radiated down her leg    08/22/2024 Patient states right hip pain down to ankle still painful. Meloxicam  did not help. Ibu helped more.    09/07/2024 Patient states prednisone  really helped. Pain came back Sunday. Low back that radiates down the legs. Lingering soreness   10/04/2024 Patient states   Relevant Historical Information: Right hip labral tear    Additional pertinent review of systems negative.   Current Outpatient Medications:    baclofen  (LIORESAL ) 10 MG tablet, Take 0.5-1 tablets (5-10 mg total) by mouth at bedtime as  needed for muscle spasms., Disp: 30 each, Rfl: 3   celecoxib  (CELEBREX ) 100 MG capsule, Take 2 capsules (200 mg total) by mouth daily., Disp: 60 capsule, Rfl: 1   meloxicam  (MOBIC ) 15 MG tablet, Take 1 tablet (15 mg total) by mouth daily., Disp: 30 tablet, Rfl: 0   meloxicam  (MOBIC ) 15 MG tablet, Take 1 tablet (15 mg total) by mouth daily as needed for pain., Disp: 30 tablet, Rfl: 0   meloxicam  (MOBIC ) 15 MG tablet, TAKE 1 TABLET (15 MG TOTAL) BY MOUTH DAILY., Disp: 30 tablet, Rfl: 0   methocarbamol  (ROBAXIN ) 500 MG tablet, TAKE 1 TABLET BY MOUTH 4 TIMES DAILY., Disp: 30 tablet, Rfl: 4   methylPREDNISolone (MEDROL DOSEPAK) 4 MG TBPK tablet, Day 1:  (2) tablets before breakfast, (1) after lunch, (1) after dinner, (2) at bedtime [24-mg] Day 2:  (1) before breakfast, (1) after lunch, (1) after dinner, (2) at bedtime [20-mg] Day 3:  (1) before breakfast, (1) after lunch, (1) after dinner, (1) at bedtime [16-mg] Day 4:  (1) before breakfast, (1) after lunch, (1) at bedtime [12-mg] Day 5:  (1) before breakfast, (1) at bedtime [8-mg] Day 6:  (1) before breakfast [4-mg], Normal, Disp: 21 tablet, Rfl: 0   MULTIPLE VITAMIN PO, Take by mouth., Disp: , Rfl:    Norgestimate-Ethinyl Estradiol Triphasic (TRI-ESTARYLLA) 0.18/0.215/0.25 MG-35 MCG tablet, Take 1 tablet by mouth daily., Disp: 84 tablet, Rfl: 0   Objective:     There were no vitals filed for this visit.    There is no height or weight on file to calculate BMI.    Physical Exam:    ***   Electronically signed by:  Odis Mace D.[name] 7:37 AM 10/03/24

## 2024-10-04 ENCOUNTER — Ambulatory Visit: Admitting: Sports Medicine

## 2024-10-12 ENCOUNTER — Ambulatory Visit: Admitting: Physician Assistant

## 2024-10-12 ENCOUNTER — Encounter: Payer: Self-pay | Admitting: Physician Assistant

## 2024-10-12 VITALS — BP 131/91 | HR 84 | Resp 16 | Ht 66.0 in | Wt 188.2 lb

## 2024-10-12 DIAGNOSIS — R030 Elevated blood-pressure reading, without diagnosis of hypertension: Secondary | ICD-10-CM

## 2024-10-12 DIAGNOSIS — R0981 Nasal congestion: Secondary | ICD-10-CM

## 2024-10-12 DIAGNOSIS — J029 Acute pharyngitis, unspecified: Secondary | ICD-10-CM | POA: Diagnosis not present

## 2024-10-12 LAB — POCT RAPID STREP A (OFFICE): Rapid Strep A Screen: NEGATIVE

## 2024-10-12 MED ORDER — NYSTATIN 100000 UNIT/ML MT SUSP: 5.0000 mL | Freq: Four times a day (QID) | OROMUCOSAL | 0 refills | Status: AC

## 2024-10-12 NOTE — Progress Notes (Signed)
 Ben Cordarryl Monrreal D.[name] [address] [place] [address] Phone: [phone]   Assessment and Plan:     1. Chronic right SI joint pain (Primary) 2. Right hip pain 3. Greater trochanteric bursitis of right hip 4. Chronic bilateral low back pain with right-sided sciatica -Chronic with exacerbation, subsequent visit - Continued flare of multiple areas of musculoskeletal pain including right lower back, right lateral hip, right anterior hip, IT band.  Likely multifactorial - We had suspicion for lumbar radiculopathy based on HPI, physical exam, however MRI is currently because prohibitive.  Patient may be able to forward MRI in spring 2026 - Patient has benefited in the past from greater trochanteric CSI and SI joint CSI.  Elected to repeat both injections today.  Tolerated well per note below - Continue HEP and physical therapy - Use meloxicam  15 mg daily as needed for breakthrough pain.  Recommend limiting chronic NSAIDs to 1-2 doses per week to prevent long-term side effects. Use Tylenol 500 to 1000 mg tablets 2-3 times a day as needed for day-to-day pain relief.     Procedure: Greater trochanteric bursal injection Side: Right  Risks explained and consent was given verbally. The site was cleaned with alcohol prep. A steroid injection was performed with patient in the lateral side-lying position at area of maximum tenderness over greater trochanter using 2mL of 1% lidocaine  without epinephrine and 1mL of kenalog 40mg /ml. This was well tolerated.  Needle was removed, hemostasis achieved, and post injection instructions were explained.  Pt was advised to call or return to clinic if these symptoms worsen or fail to improve as anticipated.    Procedure: Ultrasound Guided Sacroiliac Joint Injection  Side: Right Diagnosis: Flare of right SI joint pain US  Indication:  - accuracy is paramount for diagnosis - to ensure therapeutic efficacy or procedural  success - to reduce procedural risk  After explaining the procedure, viable alternatives, risks, and answering any questions, consent was given verbally. The site was cleaned with chlorhexidine prep. An ultrasound transducer was placed on the lumbosacral spine.  The spinous processes were identified. These were followed down from the lumbar spine to the scarum and the SI joint was identified as were the neural foramen.  A needle was introduced with care taken to avoid the neural foramen under ultrasound guidance into the SI joint with sterile technique.    A steroid injection was performed using 2ml of 1% lidocaine  without epinephrine and 40 mg of triamcinolone  (KENALOG) 40mg /ml. This was well tolerated and resulted in  relief.  Needle was removed and dressing placed and post injection instructions were given including  a discussion of likely return of pain today after the anesthetic wears off (with the possibility of worsened pain) until the steroid starts to work in 1-3 days.   Pt was advised to call or return to clinic if these symptoms worsen or fail to improve as anticipated. Images permanently stored.   Pertinent previous records reviewed include none   Follow Up: 2 to 4 weeks for reevaluation.  Could consider repeat OMT.  Could consider alternative CSI   Subjective:   I, Moenique Parris, am serving as a neurosurgeon for Doctor Morene Mace  Chief Complaint: rib pain    HPI:    10/29/2022 Patient is a 38 year old female complaining of rib pain. Patient states that it has been going on for 3 years pain , hx of torn labrum, former runner , pain from chest to knee,  TTP ribcage around to the thoracic, floating rib pain that was adjusted by chiro didn't see a difference in pain , will get intermittent sharp pain through the rib cage makes her nausea at times, ib and tylenol has not helped , sometimes numbness and tingling when she moves a certain way    11/25/2022  Patient states ready for a tune up     12/09/2022 Patients states good , overall pain has gone down but still has pain when she wakes in the am and a little of pain in si joint but especially around the right rib cage , ribs TTP under her breast feels very pressury and sore, top of hip is painful    12/24/2022 Patient states that she is good , right rib cage and outer hip are flared    01/20/2023 Patient states that she is good , she fell off on the meloxicam  she forgot some days, overall she does feel good, has a lingering dull soreness in the SI join to the bottom rib cage , but outer hip feels good though   02/25/2023  Patient states SI joint is bothering her, neck and trap tightness on the right side with some rhomboids   03/25/2023 Patient states she is stressed,    04/28/2023 Patient states would like an injection . PT June 14th, did a stretch last night that helped with pain but pain came back when she woke up    05/26/2023 Patient states she is good , 4-5 days after injection she has not has any pain she is feeling good, decreased ROM neck and pain, pain right hip deep glute and hip     09/29/2023 Patient states ready for an adjustment. Wants to discuss PRP    10/18/2023 Patient states she is here for an adjustment right side rib cage. SI joint has been decent . Right side knee pain lateral    11/03/2023 Patient states she is feeling the best she has    11/25/2023 Patient states that she has good and bad days. Pain in R SI joint and R lumbar spine pain. Pain that radiates down to the knee and R ankle. Had some trouble sleeping twice since last visit. Using meloxicam  prn.    12/22/2023 Patient states that she is feeling okay but has some low back and hip tightness    01/19/2024 Patient states not as good as she wants to feel. Past two weeks: Pain radiating down the leg down to the ankle bone. Changed some exercises then stopped thinking that that caused the flare up but it is still present.   02/24/2024 Patient states  that she is doing really well, had some pain the last two days but added 10 min run but took a meloxicam  and that helped   04/03/2024 Patient states today is more of a wellness check day. A little stiff today in the back but that is because she has not done her stretch routine yet today.    05/04/2024 Patient states is still wanting to continue OT for preventative measures     06/14/2024 Patient states she is good ready for adjustment    07/27/2024 Patient states low back pain that radiated down her leg    08/22/2024 Patient states right hip pain down to ankle still painful. Meloxicam  did not help. Ibu helped more.    09/07/2024 Patient states prednisone  really helped. Pain came back Sunday. Low back that radiates down the legs. Lingering soreness   10/13/2024 Patient states did not  have MRI due to cost 854$. Pain is intemittent. Would like to discuss CSI. She has been sick.    Relevant Historical Information: Right hip labral tear    Additional pertinent review of systems negative.   Current Outpatient Medications:    magic mouthwash (nystatin , lidocaine , diphenhydrAMINE, alum & mag hydroxide) suspension, Swish and spit 5 mLs 4 (four) times daily., Disp: 180 mL, Rfl: 0   meloxicam  (MOBIC ) 15 MG tablet, Take 1 tablet (15 mg total) by mouth daily as needed for pain., Disp: 30 tablet, Rfl: 0   Norgestimate-Ethinyl Estradiol Triphasic (TRI-ESTARYLLA) 0.18/0.215/0.25 MG-35 MCG tablet, Take 1 tablet by mouth daily., Disp: 84 tablet, Rfl: 0   Objective:     Vitals:   10/13/24 1358  Weight: 188 lb (85.3 kg)  Height: 5' 6 (1.676 m)      Body mass index is 30.34 kg/m.    Physical Exam:     Gen: Appears well, nad, nontoxic and pleasant Psych: Alert and oriented, appropriate mood and affect Neuro: sensation intact, strength is 5/5 in upper and lower extremities, muscle tone wnl Skin: no susupicious lesions or rashes   Back - Normal skin, Spine with normal alignment and no  deformity.   No tenderness to vertebral process palpation.   Lumbar paraspinous muscles are   tender and without spasm, worse on right  TTP right gluteal musculature, SI joint Straight leg raise positive right Trendelenberg negative Piriformis Test negative Gait normal    Electronically signed by:  Odis Mace D[name] 2:24 PM 10/13/24

## 2024-10-12 NOTE — Progress Notes (Signed)
 Established patient visit  Patient: Desiree Rivera   DOB: [birth year]   38 y.o. Female  MRN: [id number] Visit Date: 10/12/2024  Today's healthcare provider: Jolynn Spencer, PA-C   Chief Complaint  Patient presents with   URI    Tuesday night started with a slight sore throat, runny nose, headache. Patient has tried OTC medicine Tylenol, IBU, DayQuil, TheraFlu, zycam   Subjective      Discussed the use of AI scribe software for clinical note transcription with the patient, who gave verbal consent to proceed.  History of Present Illness Desiree Rivera is a 38 year old female who presents with a sore throat, runny nose, and headache since Tuesday.  She describes the sore throat as severe, with a tight, burning sensation that worsens with swallowing. The pain began on the right side after dinner and intensified overnight, affecting her entire throat. The sore throat disrupts her sleep, causing fatigue. She denies cough, shortness of breath, ear pain, or discharge. She experienced chills last night but no fever. She has a history of seasonal allergies and took Benadryl without relief. She also tried warm salt gargles and lozenges. Her husband had a sore throat last weekend, which resolved quickly. She attended a dinner on Tuesday night where she ate sea bass with a spicy red sauce, but she has no known food allergies.       06/01/2023    8:39 AM 10/07/2020    3:19 PM 11/18/2017    3:09 PM  Depression screen PHQ 2/9  Decreased Interest 0 0 0  Down, Depressed, Hopeless 0 0 0  PHQ - 2 Score 0 0 0  Altered sleeping 1    Tired, decreased energy 1    Change in appetite 0    Feeling bad or failure about yourself  0    Trouble concentrating 0    Moving slowly or fidgety/restless 0    Suicidal thoughts 0    PHQ-9 Score 2     Difficult doing work/chores Not difficult at all       Data saved with a previous flowsheet row definition      06/01/2023    8:39 AM  GAD 7 : Generalized  Anxiety Score  Nervous, Anxious, on Edge 2  Control/stop worrying 2  Worry too much - different things 3  Trouble relaxing 2  Restless 0  Easily annoyed or irritable 1  Afraid - awful might happen 0  Total GAD 7 Score 10  Anxiety Difficulty Somewhat difficult    Medications: Outpatient Medications Prior to Visit  Medication Sig   meloxicam  (MOBIC ) 15 MG tablet Take 1 tablet (15 mg total) by mouth daily as needed for pain.   Norgestimate-Ethinyl Estradiol Triphasic (TRI-ESTARYLLA) 0.18/0.215/0.25 MG-35 MCG tablet Take 1 tablet by mouth daily.   baclofen  (LIORESAL ) 10 MG tablet Take 0.5-1 tablets (5-10 mg total) by mouth at bedtime as needed for muscle spasms.   celecoxib  (CELEBREX ) 100 MG capsule Take 2 capsules (200 mg total) by mouth daily.   meloxicam  (MOBIC ) 15 MG tablet Take 1 tablet (15 mg total) by mouth daily.   meloxicam  (MOBIC ) 15 MG tablet TAKE 1 TABLET (15 MG TOTAL) BY MOUTH DAILY.   methocarbamol  (ROBAXIN ) 500 MG tablet TAKE 1 TABLET BY MOUTH 4 TIMES DAILY.   methylPREDNISolone  (MEDROL  DOSEPAK) 4 MG TBPK tablet Day 1:  (2) tablets before breakfast, (1) after lunch, (1) after dinner, (2) at bedtime [24-mg] Day 2:  (1) before breakfast, (1) after lunch, (1)  after dinner, (2) at bedtime [20-mg] Day 3:  (1) before breakfast, (1) after lunch, (1) after dinner, (1) at bedtime [16-mg] Day 4:  (1) before breakfast, (1) after lunch, (1) at bedtime [12-mg] Day 5:  (1) before breakfast, (1) at bedtime [8-mg] Day 6:  (1) before breakfast [4-mg], Normal   MULTIPLE VITAMIN PO Take by mouth.   No facility-administered medications prior to visit.    Review of Systems All negative Except see HPI       Objective    BP (!) 131/91 (BP Location: Left Arm, Patient Position: Sitting, Cuff Size: Normal)   Pulse 84   Resp 16   Ht 5' 6 (1.676 m)   Wt 188 lb 3.2 oz (85.4 kg)   SpO2 100%   BMI 30.38 kg/m     Physical Exam Vitals reviewed.  Constitutional:      General: She is not  in acute distress.    Appearance: Normal appearance. She is well-developed. She is not diaphoretic.  HENT:     Head: Normocephalic and atraumatic.  Eyes:     General: No scleral icterus.    Conjunctiva/sclera: Conjunctivae normal.  Neck:     Thyroid : No thyromegaly.  Cardiovascular:     Rate and Rhythm: Normal rate and regular rhythm.     Pulses: Normal pulses.     Heart sounds: Normal heart sounds. No murmur heard. Pulmonary:     Effort: Pulmonary effort is normal. No respiratory distress.     Breath sounds: Normal breath sounds. No wheezing, rhonchi or rales.  Musculoskeletal:     Cervical back: Neck supple.     Right lower leg: No edema.     Left lower leg: No edema.  Lymphadenopathy:     Cervical: No cervical adenopathy.  Skin:    General: Skin is warm and dry.     Findings: No rash.  Neurological:     Mental Status: She is alert and oriented to person, place, and time. Mental status is at baseline.  Psychiatric:        Mood and Affect: Mood normal.        Behavior: Behavior normal.      No results found for any visits on 10/12/24.      Assessment & Plan Acute pharyngitis Sore throat likely viral, less likely bacterial. Lymphadenopathy noted. No significant response to Benadryl. Husband's similar symptoms resolved quickly. - Continue warm salt gargles and lozenges. - Use magic mouthwash for pain relief. - Increase fluid intake. - Use Vicks Vaporub, Tylenol, and ibuprofen for symptom management. - Consider Benadryl at night, Allegra or Claritin in the morning. - Advised to use cough lozenges Will follow-up  Nasal congestion Mild nasal congestion possibly due to viral infection or allergies. - Use nasal saline spray. - Consider Flonase for relief. - Continue OTC antihistamines Will follow-up  Sore throat (Primary)  - POCT rapid strep A - magic mouthwash (nystatin , lidocaine , diphenhydrAMINE, alum & mag hydroxide) suspension; Swish and spit 5 mLs 4 (four)  times daily.  Dispense: 180 mL; Refill: 0  Elevated BP reading w/ no diagnosis of HTN BP was 131/91 Advised to measure BP at home Continue low sodium diet and regular exercise Will reassess at the follow-up   Orders Placed This Encounter  Procedures   POCT rapid strep A    No follow-ups on file.   The patient was advised to call back or seek an in-person evaluation if the symptoms worsen or if the condition fails to  improve as anticipated.  I discussed the assessment and treatment plan with the patient. The patient was provided an opportunity to ask questions and all were answered. The patient agreed with the plan and demonstrated an understanding of the instructions.  I, Abdirahman Chittum, PA-C have reviewed all documentation for this visit. The documentation on 10/12/2024  for the exam, diagnosis, procedures, and orders are all accurate and complete.  Jolynn Spencer, [facility], MMS [facility] [phone] (phone) [phone] (fax)  [facility]

## 2024-10-13 ENCOUNTER — Ambulatory Visit: Admitting: Sports Medicine

## 2024-10-13 ENCOUNTER — Other Ambulatory Visit: Payer: Self-pay

## 2024-10-13 VITALS — Ht 66.0 in | Wt 188.0 lb

## 2024-10-13 DIAGNOSIS — M25551 Pain in right hip: Secondary | ICD-10-CM

## 2024-10-13 DIAGNOSIS — M7061 Trochanteric bursitis, right hip: Secondary | ICD-10-CM | POA: Diagnosis not present

## 2024-10-13 DIAGNOSIS — G8929 Other chronic pain: Secondary | ICD-10-CM

## 2024-10-13 DIAGNOSIS — M5441 Lumbago with sciatica, right side: Secondary | ICD-10-CM

## 2024-10-13 DIAGNOSIS — M533 Sacrococcygeal disorders, not elsewhere classified: Secondary | ICD-10-CM

## 2024-10-13 NOTE — Patient Instructions (Signed)
2-4 week follow up.

## 2024-11-07 NOTE — Progress Notes (Unsigned)
 Desiree Rivera [facility] [address] [place] [address] Phone: [phone]   Assessment and Plan:     1. Chronic right SI joint pain (Primary) 2. Right hip pain 3. Greater trochanteric bursitis of right hip -Chronic with exacerbation, subsequent visit - Overall significant improvements in multiple areas of musculoskeletal pain including right lower back, right lateral hip, right anterior hip, IT band.  Patient had significant improvement with right greater trochanteric CSI and right SI joint CSI performed at previous office visit on 10/13/2024 - Patient still experiencing intermittent pain primarily at night likely due to sleeping position.  Recommend taking Tylenol 500 to 1000 mg prior to sleep to decrease nighttime awakenings - Use meloxicam  15 mg daily as needed for breakthrough pain.  Recommend limiting chronic NSAIDs to 1-2 doses per week to prevent long-term side effects. Use Tylenol 500 to 1000 mg tablets 2-3 times a day as needed for day-to-day pain relief.     4. Chronic right shoulder pain 5. Subacromial bursitis of right shoulder joint -Chronic with exacerbation, initial visit - Most consistent with right subacromial bursitis likely due to side sleeping position, physical activity - Patient elected for subacromial CSI.  Tolerated well per note below - Start HEP for rotator cuff - Start meloxicam  15 mg daily x2 weeks.  If still having pain after 2 weeks, complete 3rd-week of NSAID. May use remaining NSAID as needed once daily for pain control.  Do not to use additional over-the-counter NSAIDs (ibuprofen, naproxen, Advil, Aleve, etc.) while taking prescription NSAIDs.  May use Tylenol [phone] mg 2 to 3 times a day for breakthrough pain.   Procedure: Subacromial Injection Side: Right  Risks explained and consent was given verbally. The site was cleaned with alcohol prep. A steroid injection was performed from posterior approach using 2mL of 1%  lidocaine  without epinephrine and 1mL of kenalog 40mg /ml. This was well tolerated.  Needle was removed, hemostasis achieved, and post injection instructions were explained.   Pt was advised to call or return to clinic if these symptoms worsen or fail to improve as anticipated.    Pertinent previous records reviewed include none  Follow Up: 4 weeks for reevaluation.  Could consider repeat OMT versus alternative CSI's   Subjective:   I, Desiree Rivera am a scribe for Dr. Leonce.    Chief Complaint: rib pain    HPI:    10/29/2022 Patient is a 38 year old female complaining of rib pain. Patient states that it has been going on for 3 years pain , hx of torn labrum, former runner , pain from chest to knee, TTP ribcage around to the thoracic, floating rib pain that was adjusted by chiro didn't see a difference in pain , will get intermittent sharp pain through the rib cage makes her nausea at times, ib and tylenol has not helped , sometimes numbness and tingling when she moves a certain way    11/25/2022  Patient states ready for a tune up    12/09/2022 Patients states good , overall pain has gone down but still has pain when she wakes in the am and a little of pain in si joint but especially around the right rib cage , ribs TTP under her breast feels very pressury and sore, top of hip is painful    12/24/2022 Patient states that she is good , right rib cage and outer hip are flared    01/20/2023 Patient states that she is good , she  fell off on the meloxicam  she forgot some days, overall she does feel good, has a lingering dull soreness in the SI join to the bottom rib cage , but outer hip feels good though   02/25/2023  Patient states SI joint is bothering her, neck and trap tightness on the right side with some rhomboids   03/25/2023 Patient states she is stressed,    04/28/2023 Patient states would like an injection . PT June 14th, did a stretch last night that helped with pain but pain came  back when she woke up    05/26/2023 Patient states she is good , 4-5 days after injection she has not has any pain she is feeling good, decreased ROM neck and pain, pain right hip deep glute and hip     09/29/2023 Patient states ready for an adjustment. Wants to discuss PRP    10/18/2023 Patient states she is here for an adjustment right side rib cage. SI joint has been decent . Right side knee pain lateral    11/03/2023 Patient states she is feeling the best she has    11/25/2023 Patient states that she has good and bad days. Pain in R SI joint and R lumbar spine pain. Pain that radiates down to the knee and R ankle. Had some trouble sleeping twice since last visit. Using meloxicam  prn.    12/22/2023 Patient states that she is feeling okay but has some low back and hip tightness    01/19/2024 Patient states not as good as she wants to feel. Past two weeks: Pain radiating down the leg down to the ankle bone. Changed some exercises then stopped thinking that that caused the flare up but it is still present.   02/24/2024 Patient states that she is doing really well, had some pain the last two days but added 10 min run but took a meloxicam  and that helped   04/03/2024 Patient states today is more of a wellness check day. A little stiff today in the back but that is because she has not done her stretch routine yet today.    05/04/2024 Patient states is still wanting to continue OT for preventative measures     06/14/2024 Patient states she is good ready for adjustment    07/27/2024 Patient states low back pain that radiated down her leg    08/22/2024 Patient states right hip pain down to ankle still painful. Meloxicam  did not help. Ibu helped more.    09/07/2024 Patient states prednisone  really helped. Pain came back Sunday. Low back that radiates down the legs. Lingering soreness    10/13/2024 Patient states did not have MRI due to cost 854$. Pain is intemittent. Would like to discuss CSI.  She has been sick.   11/08/2024 Patient states that she is doing much better since the injections. Still having some pain when she wakes up in the morning but she thinks it is how she sleeps. Once she gets up and gets going she is fine.    Relevant Historical Information: Right hip labral tear  Additional pertinent review of systems negative.  Current Outpatient Medications  Medication Sig Dispense Refill   magic mouthwash (nystatin , lidocaine , diphenhydrAMINE, alum & mag hydroxide) suspension Swish and spit 5 mLs 4 (four) times daily. 180 mL 0   meloxicam  (MOBIC ) 15 MG tablet Take 1 tablet (15 mg total) by mouth daily as needed for pain. 30 tablet 0   Norgestimate-Ethinyl Estradiol Triphasic (TRI-ESTARYLLA) 0.18/0.215/0.25 MG-35 MCG tablet Take 1 tablet by  mouth daily. 84 tablet 0   No current facility-administered medications for this visit.      Objective:     Vitals:   11/08/24 1444  BP: 110/60  Pulse: 84  SpO2: 98%  Weight: 189 lb (85.7 kg)  Height: 5' 6 (1.676 m)      Body mass index is 30.51 kg/m.    Physical Exam:     Gen: Appears well, nad, nontoxic and pleasant Neuro:sensation intact, strength is 5/5, muscle tone wnl Skin: no suspicious lesion or defmority Psych: A&O, appropriate mood and affect  Right shoulder:  No deformity, swelling or muscle wasting No scapular winging FF 180, abd 180, int 0, ext 90 TTP trapezius, rhomboid, deltoid NTTP over the [place], clavicle, ac, coracoid, biceps groove, humerus,  , cervical spine Positive neer, hawkins, empty can, obriens, Negative crossarm, subscap liftoff, speeds Neg ant drawer, sulcus sign, apprehension Negative Spurling's test bilat FROM of neck   Electronically signed by:  Odis Mace [facility] 3:17 PM 11/08/2024

## 2024-11-08 ENCOUNTER — Ambulatory Visit: Admitting: Sports Medicine

## 2024-11-08 VITALS — BP 110/60 | HR 84 | Ht 66.0 in | Wt 189.0 lb

## 2024-11-08 DIAGNOSIS — G8929 Other chronic pain: Secondary | ICD-10-CM | POA: Diagnosis not present

## 2024-11-08 DIAGNOSIS — M533 Sacrococcygeal disorders, not elsewhere classified: Secondary | ICD-10-CM | POA: Diagnosis not present

## 2024-11-08 DIAGNOSIS — M25551 Pain in right hip: Secondary | ICD-10-CM | POA: Diagnosis not present

## 2024-11-08 DIAGNOSIS — M7061 Trochanteric bursitis, right hip: Secondary | ICD-10-CM

## 2024-11-08 DIAGNOSIS — M7551 Bursitis of right shoulder: Secondary | ICD-10-CM

## 2024-11-08 DIAGNOSIS — M25511 Pain in right shoulder: Secondary | ICD-10-CM

## 2024-11-08 NOTE — Patient Instructions (Addendum)
 Good to see you  Right shoulder injection given today  Rotator cuff exercises given today  Follow up in 4 weeks    Recommend taking Tylenol 500 to 1000 mg prior to sleep to decrease nighttime awakenings - Use meloxicam  15 mg daily as needed for breakthrough pain.  Recommend limiting chronic NSAIDs to 1-2 doses per week to prevent long-term side effects.

## 2024-11-20 ENCOUNTER — Other Ambulatory Visit: Payer: Self-pay

## 2024-11-20 DIAGNOSIS — Z01419 Encounter for gynecological examination (general) (routine) without abnormal findings: Secondary | ICD-10-CM

## 2024-11-20 DIAGNOSIS — Z3041 Encounter for surveillance of contraceptive pills: Secondary | ICD-10-CM

## 2024-11-20 MED ORDER — NORGESTIM-ETH ESTRAD TRIPHASIC 0.18/0.215/0.25 MG-35 MCG PO TABS: 1.0000 | ORAL_TABLET | Freq: Every day | ORAL | 0 refills | Status: DC

## 2024-12-06 ENCOUNTER — Ambulatory Visit: Admitting: Sports Medicine

## 2024-12-07 NOTE — Progress Notes (Unsigned)
 "  [facility] [address] [place] [address] Phone: [phone]   Assessment and Plan:     1. Chronic right SI joint pain (Primary) 2. Right hip pain 3. Greater trochanteric bursitis of right hip 4. Somatic dysfunction of cervical region 5. Somatic dysfunction of thoracic region 6. Somatic dysfunction of lumbar region 7. Somatic dysfunction of pelvic region 8. Somatic dysfunction of sacral region -Chronic with exacerbation, subsequent visit - Recurrence in multiple areas of musculoskeletal pain including right hip, right SI joint, right sided neck - Use meloxicam  15 mg daily as needed for breakthrough pain.  Recommend limiting chronic NSAIDs to 1-2 doses per week to prevent long-term side effects. Use Tylenol 500 to 1000 mg tablets 2-3 times a day as needed for day-to-day pain relief.    - Patient has received relief with OMT in the past.  Elects for repeat OMT today.  Tolerated well per note below. - Decision today to treat with OMT was based on Physical Exam   After verbal consent patient was treated with HVLA (high velocity low amplitude), ME (muscle energy), FPR (flex positional release), ST (soft tissue), PC/PD (Pelvic Compression/ Pelvic Decompression) techniques in cervical, sacral, thoracic, lumbar, and pelvic areas. Patient tolerated the procedure well with improvement in symptoms.  Patient educated on potential side effects of soreness and recommended to rest, hydrate, and use Tylenol as needed for pain control.    9. Chronic right shoulder pain 10. Subacromial bursitis of right shoulder joint -Chronic with exacerbation, subsequent visit - Overall improvement in right subacromial bursitis after subacromial CSI performed on 11/08/2024, starting HEP.  Still intermittent areas of surrounding muscular pain likely due to compensation - Continue HEP for shoulder - Use meloxicam  15 mg daily as needed for breakthrough pain.  Recommend  limiting chronic NSAIDs to 1-2 doses per week to prevent long-term side effects. Use Tylenol 500 to 1000 mg tablets 2-3 times a day as needed for day-to-day pain relief.      Pertinent previous records reviewed include none  Follow Up: 4 weeks for reevaluation.  Could consider repeat OMT versus further evaluation of areas of ongoing pain   Subjective:   I, Navreet Bolda, am serving as a neurosurgeon for Doctor Morene Mace  Chief Complaint: rib pain    HPI:    10/29/2022 Patient is a 39 year old female complaining of rib pain. Patient states that it has been going on for 3 years pain , hx of torn labrum, former runner , pain from chest to knee, TTP ribcage around to the thoracic, floating rib pain that was adjusted by chiro didn't see a difference in pain , will get intermittent sharp pain through the rib cage makes her nausea at times, ib and tylenol has not helped , sometimes numbness and tingling when she moves a certain way    11/25/2022  Patient states ready for a tune up    12/09/2022 Patients states good , overall pain has gone down but still has pain when she wakes in the am and a little of pain in si joint but especially around the right rib cage , ribs TTP under her breast feels very pressury and sore, top of hip is painful    12/24/2022 Patient states that she is good , right rib cage and outer hip are flared    01/20/2023 Patient states that she is good , she fell off on the meloxicam  she forgot some days, overall she does  feel good, has a lingering dull soreness in the SI join to the bottom rib cage , but outer hip feels good though   02/25/2023  Patient states SI joint is bothering her, neck and trap tightness on the right side with some rhomboids   03/25/2023 Patient states she is stressed,    04/28/2023 Patient states would like an injection . PT June 14th, did a stretch last night that helped with pain but pain came back when she woke up    05/26/2023 Patient states she is  good , 4-5 days after injection she has not has any pain she is feeling good, decreased ROM neck and pain, pain right hip deep glute and hip     09/29/2023 Patient states ready for an adjustment. Wants to discuss PRP    10/18/2023 Patient states she is here for an adjustment right side rib cage. SI joint has been decent . Right side knee pain lateral    11/03/2023 Patient states she is feeling the best she has    11/25/2023 Patient states that she has good and bad days. Pain in R SI joint and R lumbar spine pain. Pain that radiates down to the knee and R ankle. Had some trouble sleeping twice since last visit. Using meloxicam  prn.    12/22/2023 Patient states that she is feeling okay but has some low back and hip tightness    01/19/2024 Patient states not as good as she wants to feel. Past two weeks: Pain radiating down the leg down to the ankle bone. Changed some exercises then stopped thinking that that caused the flare up but it is still present.   02/24/2024 Patient states that she is doing really well, had some pain the last two days but added 10 min run but took a meloxicam  and that helped   04/03/2024 Patient states today is more of a wellness check day. A little stiff today in the back but that is because she has not done her stretch routine yet today.    05/04/2024 Patient states is still wanting to continue OT for preventative measures     06/14/2024 Patient states she is good ready for adjustment    07/27/2024 Patient states low back pain that radiated down her leg    08/22/2024 Patient states right hip pain down to ankle still painful. Meloxicam  did not help. Ibu helped more.    09/07/2024 Patient states prednisone  really helped. Pain came back Sunday. Low back that radiates down the legs. Lingering soreness    10/13/2024 Patient states did not have MRI due to cost 854$. Pain is intemittent. Would like to discuss CSI. She has been sick.    11/08/2024 Patient states that  she is doing much better since the injections. Still having some pain when she wakes up in the morning but she thinks it is how she sleeps. Once she gets up and gets going she is fine.   12/08/2024 Patient states shoulder feels better. Deep soreness around shoulder blade. Si joint flared    Relevant Historical Information: Right hip labral tear  Additional pertinent review of systems negative.  Current Outpatient Medications  Medication Sig Dispense Refill   magic mouthwash (nystatin , lidocaine , diphenhydrAMINE, alum & mag hydroxide) suspension Swish and spit 5 mLs 4 (four) times daily. 180 mL 0   meloxicam  (MOBIC ) 15 MG tablet Take 1 tablet (15 mg total) by mouth daily as needed for pain. 30 tablet 0   Norgestimate-Ethinyl Estradiol Triphasic (TRI-ESTARYLLA) 0.18/0.215/0.25 MG-35 MCG  tablet Take 1 tablet by mouth daily. 84 tablet 0   No current facility-administered medications for this visit.      Objective:     Vitals:   12/08/24 1128  BP: 136/78  Pulse: 70  SpO2: 100%  Weight: 189 lb (85.7 kg)  Height: 5' 6 (1.676 m)      Body mass index is 30.51 kg/m.    Physical Exam:     General: Well-appearing, cooperative, sitting comfortably in no acute distress.   OMT Physical Exam:  ASIS Compression Test: Positive Right Cervical: TTP paraspinal, C3-5 RRSR Sacrum: Positive sphinx, TTP right sacral base Thoracic: TTP paraspinal, T2-4 RRSL, T6 RRSR Lumbar: TTP paraspinal, L1-3 RRSL Pelvis: Right anterior innominate  Electronically signed by:  [facility] 11:51 AM 12/08/24 "

## 2024-12-08 ENCOUNTER — Ambulatory Visit: Admitting: Sports Medicine

## 2024-12-08 VITALS — BP 136/78 | HR 70 | Ht 66.0 in | Wt 189.0 lb

## 2024-12-08 DIAGNOSIS — M9902 Segmental and somatic dysfunction of thoracic region: Secondary | ICD-10-CM | POA: Diagnosis not present

## 2024-12-08 DIAGNOSIS — M7551 Bursitis of right shoulder: Secondary | ICD-10-CM

## 2024-12-08 DIAGNOSIS — M25511 Pain in right shoulder: Secondary | ICD-10-CM | POA: Diagnosis not present

## 2024-12-08 DIAGNOSIS — M533 Sacrococcygeal disorders, not elsewhere classified: Secondary | ICD-10-CM | POA: Diagnosis not present

## 2024-12-08 DIAGNOSIS — M7061 Trochanteric bursitis, right hip: Secondary | ICD-10-CM

## 2024-12-08 DIAGNOSIS — M9904 Segmental and somatic dysfunction of sacral region: Secondary | ICD-10-CM

## 2024-12-08 DIAGNOSIS — M9905 Segmental and somatic dysfunction of pelvic region: Secondary | ICD-10-CM | POA: Diagnosis not present

## 2024-12-08 DIAGNOSIS — M9903 Segmental and somatic dysfunction of lumbar region: Secondary | ICD-10-CM

## 2024-12-08 DIAGNOSIS — G8929 Other chronic pain: Secondary | ICD-10-CM

## 2024-12-08 DIAGNOSIS — M25551 Pain in right hip: Secondary | ICD-10-CM | POA: Diagnosis not present

## 2024-12-08 DIAGNOSIS — M9901 Segmental and somatic dysfunction of cervical region: Secondary | ICD-10-CM | POA: Diagnosis not present

## 2024-12-11 NOTE — Progress Notes (Unsigned)
 "    Gynecology Annual Exam   PCP: Gasper Nancyann BRAVO, MD  Chief Complaint: No chief complaint on file.   History of Present Illness: Patient is a 39 y.o. G3P0030 presents for annual exam. The patient has no complaints today.   LMP: No LMP recorded. (Menstrual status: Oral contraceptives). Average Interval: {Desc; regular/irreg:14544}, {numbers 22-35:14824} days Duration of flow: {numbers; 0-10:33138} days Heavy Menses: {yes/no:63} Clots: {yes/no:63} Intermenstrual Bleeding: {yes/no:63} Postcoital Bleeding: {yes/no:63} Dysmenorrhea: {yes/no:63}  The patient {sys sexually active:13135} sexually active. She currently uses {method:5051} for contraception. She {has/denies:315300} dyspareunia.  The patient {DOES_DOES WNU:81435} perform self breast exams.  There {is/is no:19420} notable family history of breast or ovarian cancer in her family.  The patient wears seatbelts: {yes/no:63}.   The patient has regular exercise: {yes/no/not asked:9010}.    The patient {Blank single:19197::reports,denies} current symptoms of depression.    Review of Systems: ROS  Past Medical History:  Patient Active Problem List   Diagnosis Date Noted Date Diagnosed   Sore throat 10/12/2024    Palpitations 06/02/2023    Family history of diabetes mellitus (DM) 06/02/2023    Anxiety 06/02/2023    Weight gain 06/02/2023    Personal history of other diseases of the female genital tract 11/25/2016    History of other malignant neoplasm of skin 11/25/2016     on nose    Allergic rhinitis 11/25/2016    Cephalalgia 04/20/2006     Past Surgical History:  Past Surgical History:  Procedure Laterality Date   LEEP     TONSILLECTOMY     WISDOM TOOTH EXTRACTION      Gynecologic History:  No LMP recorded. (Menstrual status: Oral contraceptives). Contraception: {method:5051} Last Pap: Results were: {Findings; lab pap smear results:16707::NIL and HR HPV+,NIL and HR HPV negative}   Obstetric History:  G3P0030  Family History:  Family History  Problem Relation Age of Onset   Diabetes Mellitus II Maternal Grandmother    Thyroid  cancer Maternal Aunt    Lung cancer Maternal Grandfather     Social History:  Social History   Socioeconomic History   Marital status: Married    Spouse name: Not on file   Number of children: Not on file   Years of education: Not on file   Highest education level: Not on file  Occupational History   Not on file  Tobacco Use   Smoking status: Former    Current packs/day: 0.00    Types: Cigarettes    Quit date: 11/24/2007    Years since quitting: 17.0   Smokeless tobacco: Never  Vaping Use   Vaping status: Never Used  Substance and Sexual Activity   Alcohol use: Not on file   Drug use: No   Sexual activity: Yes    Partners: Male    Birth control/protection: Pill  Other Topics Concern   Not on file  Social History Narrative   Not on file   Social Drivers of Health   Tobacco Use: Medium Risk (10/12/2024)   Patient History    Smoking Tobacco Use: Former    Smokeless Tobacco Use: Never    Passive Exposure: Not on Actuary Strain: Not on file  Food Insecurity: Not on file  Transportation Needs: Not on file  Physical Activity: Not on file  Stress: Not on file  Social Connections: Not on file  Intimate Partner Violence: Not on file  Depression (PHQ2-9): Low Risk (06/01/2023)   Depression (PHQ2-9)    PHQ-2 Score: 2  Alcohol  Screen: Low Risk (06/01/2023)   Alcohol Screen    Last Alcohol Screening Score (AUDIT): 6  Housing: Not on file  Utilities: Not on file  Health Literacy: Not on file    Allergies:  Allergies[1]  Medications: Prior to Admission medications  Medication Sig Start Date End Date Taking? Authorizing Provider  magic mouthwash (nystatin , lidocaine , diphenhydrAMINE, alum & mag hydroxide) suspension Swish and spit 5 mLs 4 (four) times daily. 10/12/24   Ostwalt, Janna, PA-C  meloxicam  (MOBIC ) 15 MG tablet Take  1 tablet (15 mg total) by mouth daily as needed for pain. 01/19/24   Leonce Katz, DO  Norgestimate-Ethinyl Estradiol Triphasic (TRI-ESTARYLLA) 0.18/0.215/0.25 MG-35 MCG tablet Take 1 tablet by mouth daily. 11/20/24   DominicJinnie Jansky, CNM    Physical Exam Vitals: There were no vitals taken for this visit.  General: NAD HEENT: normocephalic, anicteric Thyroid : no enlargement, no palpable nodules Pulmonary: No increased work of breathing, CTAB Cardiovascular: RRR, distal pulses 2+ Breast: Breast symmetrical, no tenderness, no palpable nodules or masses, no skin or nipple retraction present, no nipple discharge.  No axillary or supraclavicular lymphadenopathy. Abdomen: NABS, soft, non-tender, non-distended.  Umbilicus without lesions.  No hepatomegaly, splenomegaly or masses palpable. No evidence of hernia  Genitourinary:  External: Normal external female genitalia.  Normal urethral meatus, normal Bartholin's and Skene's glands.    Vagina: Normal vaginal mucosa, no evidence of prolapse.    Cervix: Grossly normal in appearance, no bleeding  Uterus: Non-enlarged, mobile, normal contour.  No CMT  Adnexa: ovaries non-enlarged, no adnexal masses  Rectal: deferred  Lymphatic: no evidence of inguinal lymphadenopathy Extremities: no edema, erythema, or tenderness Neurologic: Grossly intact Psychiatric: mood appropriate, affect full  Female chaperone present for pelvic and breast  portions of the physical exam    Assessment: 39 y.o. G3P0030 routine annual exam  Plan: Problem List Items Addressed This Visit   None   2) STI screening  {Blank single:19197::was,was not}offered and {Blank single:19197::accepted,declined,therefore not obtained}  2)  ASCCP guidelines and rational discussed.  Patient opts for {Blank single:19197::***,every 5 years,every 3 years,yearly,discontinue age >65,discontinue secondary to prior hysterectomy} screening interval  3)  Contraception - the patient is currently using  {method:5051}.  She is {Blank single:19197::happy with her current form of contraception and plans to continue,interested in changing to ***,interested in starting Contraception: ***,not currently in need of contraception secondary to being sterile,attempting to conceive in the near future}  4) Routine healthcare maintenance including cholesterol, diabetes screening discussed {Blank single:19197::managed by PCP,Ordered today,To return fasting at a later date,Declines}  5) No follow-ups on file.  Jinnie Cookey, CNM  [facility] 12/11/2024, 2:32 PM        [1]  Allergies Allergen Reactions   Sulfa Antibiotics Rash   "

## 2024-12-12 ENCOUNTER — Other Ambulatory Visit (HOSPITAL_COMMUNITY)
Admission: RE | Admit: 2024-12-12 | Discharge: 2024-12-12 | Disposition: A | Source: Ambulatory Visit | Attending: Licensed Practical Nurse | Admitting: Licensed Practical Nurse

## 2024-12-12 ENCOUNTER — Ambulatory Visit: Admitting: Licensed Practical Nurse

## 2024-12-12 ENCOUNTER — Encounter: Payer: Self-pay | Admitting: Licensed Practical Nurse

## 2024-12-12 VITALS — BP 129/89 | HR 74 | Ht 65.0 in | Wt 199.8 lb

## 2024-12-12 DIAGNOSIS — Z124 Encounter for screening for malignant neoplasm of cervix: Secondary | ICD-10-CM

## 2024-12-12 DIAGNOSIS — Z13220 Encounter for screening for lipoid disorders: Secondary | ICD-10-CM

## 2024-12-12 DIAGNOSIS — Z131 Encounter for screening for diabetes mellitus: Secondary | ICD-10-CM

## 2024-12-12 DIAGNOSIS — Z01419 Encounter for gynecological examination (general) (routine) without abnormal findings: Secondary | ICD-10-CM | POA: Diagnosis not present

## 2024-12-12 DIAGNOSIS — Z3040 Encounter for surveillance of contraceptives, unspecified: Secondary | ICD-10-CM

## 2024-12-12 MED ORDER — NORETHINDRONE 0.35 MG PO TABS: 1.0000 | ORAL_TABLET | Freq: Every day | ORAL | 3 refills | Status: AC

## 2024-12-12 MED ORDER — NORETHINDRONE 0.35 MG PO TABS: 1.0000 | ORAL_TABLET | Freq: Every day | ORAL | 11 refills | Status: DC

## 2024-12-13 LAB — TSH+FREE T4
Free T4: 0.97 ng/dL (ref 0.82–1.77)
TSH: 1.5 u[IU]/mL (ref 0.450–4.500)

## 2024-12-13 LAB — CBC WITH DIFFERENTIAL/PLATELET
Basophils Absolute: 0.1 x10E3/uL (ref 0.0–0.2)
Basos: 1 %
EOS (ABSOLUTE): 0.1 x10E3/uL (ref 0.0–0.4)
Eos: 1 %
Hematocrit: 40.5 % (ref 34.0–46.6)
Hemoglobin: 13.5 g/dL (ref 11.1–15.9)
Immature Grans (Abs): 0 x10E3/uL (ref 0.0–0.1)
Immature Granulocytes: 0 %
Lymphocytes Absolute: 2.3 x10E3/uL (ref 0.7–3.1)
Lymphs: 28 %
MCH: 33.3 pg — ABNORMAL HIGH (ref 26.6–33.0)
MCHC: 33.3 g/dL (ref 31.5–35.7)
MCV: 100 fL — ABNORMAL HIGH (ref 79–97)
Monocytes Absolute: 0.4 x10E3/uL (ref 0.1–0.9)
Monocytes: 5 %
Neutrophils Absolute: 5.6 x10E3/uL (ref 1.4–7.0)
Neutrophils: 65 %
Platelets: 301 x10E3/uL (ref 150–450)
RBC: 4.05 x10E6/uL (ref 3.77–5.28)
RDW: 11.7 % (ref 11.7–15.4)
WBC: 8.4 x10E3/uL (ref 3.4–10.8)

## 2024-12-13 LAB — LIPID PANEL
Chol/HDL Ratio: 1.9 ratio (ref 0.0–4.4)
Cholesterol, Total: 225 mg/dL — ABNORMAL HIGH (ref 100–199)
HDL: 121 mg/dL
LDL Chol Calc (NIH): 88 mg/dL (ref 0–99)
Triglycerides: 95 mg/dL (ref 0–149)
VLDL Cholesterol Cal: 16 mg/dL (ref 5–40)

## 2024-12-13 LAB — COMPREHENSIVE METABOLIC PANEL WITH GFR
ALT: 11 IU/L (ref 0–32)
AST: 21 IU/L (ref 0–40)
Albumin: 4.4 g/dL (ref 3.9–4.9)
Alkaline Phosphatase: 75 IU/L (ref 41–116)
BUN/Creatinine Ratio: 18 (ref 9–23)
BUN: 12 mg/dL (ref 6–20)
Bilirubin Total: 0.3 mg/dL (ref 0.0–1.2)
CO2: 21 mmol/L (ref 20–29)
Calcium: 9.3 mg/dL (ref 8.7–10.2)
Chloride: 102 mmol/L (ref 96–106)
Creatinine, Ser: 0.65 mg/dL (ref 0.57–1.00)
Globulin, Total: 2.5 g/dL (ref 1.5–4.5)
Glucose: 87 mg/dL (ref 70–99)
Potassium: 4.5 mmol/L (ref 3.5–5.2)
Sodium: 136 mmol/L (ref 134–144)
Total Protein: 6.9 g/dL (ref 6.0–8.5)
eGFR: 116 mL/min/1.73

## 2024-12-13 LAB — HEMOGLOBIN A1C
Est. average glucose Bld gHb Est-mCnc: 100 mg/dL
Hgb A1c MFr Bld: 5.1 % (ref 4.8–5.6)

## 2024-12-13 LAB — CYTOLOGY - PAP
Comment: NEGATIVE
Diagnosis: NEGATIVE
High risk HPV: NEGATIVE

## 2024-12-26 ENCOUNTER — Encounter: Admitting: General Practice

## 2025-01-04 ENCOUNTER — Ambulatory Visit: Admitting: Sports Medicine
